# Patient Record
Sex: Male | Born: 1970 | Marital: Single | Smoking: Current some day smoker
Health system: Southern US, Community
[De-identification: ages and names within clinical notes are randomized; demographics above are authoritative.]

## PROBLEM LIST (undated history)

## (undated) HISTORY — PX: EXPLORATORY LAPAROTOMY: SUR591

---

## 2015-09-02 ENCOUNTER — Ambulatory Visit (INDEPENDENT_AMBULATORY_CARE_PROVIDER_SITE_OTHER): Payer: BLUE CROSS/BLUE SHIELD | Admitting: *Deleted

## 2015-09-02 DIAGNOSIS — Z23 Encounter for immunization: Secondary | ICD-10-CM | POA: Diagnosis not present

## 2016-06-08 ENCOUNTER — Other Ambulatory Visit: Payer: Self-pay

## 2016-06-08 LAB — URINALYSIS
Bilirubin, UA: NEGATIVE
Glucose, UA: NEGATIVE
KETONES UA: NEGATIVE
Leukocytes, UA: NEGATIVE
NITRITE UA: NEGATIVE
Protein, UA: NEGATIVE
RBC UA: NEGATIVE
SPEC GRAV UA: 1.025 (ref 1.005–1.030)
UUROB: 0.2 mg/dL (ref 0.2–1.0)
pH, UA: 5.5 (ref 5.0–7.5)

## 2017-03-16 ENCOUNTER — Encounter: Payer: BLUE CROSS/BLUE SHIELD | Admitting: Family Medicine

## 2017-03-17 ENCOUNTER — Encounter: Payer: Self-pay | Admitting: Family Medicine

## 2017-03-21 ENCOUNTER — Ambulatory Visit (INDEPENDENT_AMBULATORY_CARE_PROVIDER_SITE_OTHER): Payer: BLUE CROSS/BLUE SHIELD | Admitting: Family Medicine

## 2017-03-21 ENCOUNTER — Encounter: Payer: Self-pay | Admitting: Family Medicine

## 2017-03-21 VITALS — BP 105/55 | HR 78 | Temp 99.0°F | Ht 71.0 in | Wt 191.0 lb

## 2017-03-21 DIAGNOSIS — Z0000 Encounter for general adult medical examination without abnormal findings: Secondary | ICD-10-CM

## 2017-03-21 NOTE — Progress Notes (Signed)
Subjective:  Patient ID: Vincent Sandoval, male    DOB: 08/31/1971  Age: 46 y.o. MRN: [id number]  CC: New Patient (Initial Visit) (pt here today for Work Physical)   HPI [name] presents for Complete physical examination.   History [name] has no past medical history on file.   He has a past surgical history that includes Exploratory laparotomy (Left).   His family history includes Diabetes in his brother; Hearing loss in his brother; Hypertension in his brother and brother.He reports that he has been smoking Cigarettes.  He has never used smokeless tobacco. He reports that he does not drink alcohol or use drugs.    ROS Review of Systems  Constitutional: Negative for activity change, appetite change, chills, diaphoresis, fatigue, fever and unexpected weight change.  HENT: Negative for congestion, ear pain, hearing loss, postnasal drip, rhinorrhea, sore throat, tinnitus and trouble swallowing.   Eyes: Negative for photophobia, pain, discharge and redness.  Respiratory: Negative for apnea, cough, choking, chest tightness, shortness of breath, wheezing and stridor.   Cardiovascular: Negative for chest pain, palpitations and leg swelling.  Gastrointestinal: Negative for abdominal distention, abdominal pain, blood in stool, constipation, diarrhea, nausea and vomiting.  Endocrine: Negative for cold intolerance, heat intolerance, polydipsia, polyphagia and polyuria.  Genitourinary: Negative for difficulty urinating, dysuria, enuresis, flank pain, frequency, genital sores, hematuria and urgency.  Musculoskeletal: Negative for arthralgias and joint swelling.  Skin: Negative for color change, rash and wound.  Allergic/Immunologic: Negative for immunocompromised state.  Neurological: Negative for dizziness, tremors, seizures, syncope, facial asymmetry, speech difficulty, weakness, light-headedness, numbness and headaches.  Hematological: Does not bruise/bleed easily.  Psychiatric/Behavioral:  Negative for agitation, behavioral problems, confusion, decreased concentration, dysphoric mood, hallucinations, sleep disturbance and suicidal ideas. The patient is not nervous/anxious and is not hyperactive.     Objective:  BP (!) 105/55   Pulse 78   Temp 99 F (37.2 C) (Oral)   Ht 5' 11"  (1.803 m)   Wt 191 lb (86.6 kg)   BMI 26.64 kg/m   BP Readings from Last 3 Encounters:  03/21/17 (!) 105/55    Wt Readings from Last 3 Encounters:  03/21/17 191 lb (86.6 kg)     Physical Exam  Constitutional: He is oriented to person, place, and time. He appears well-developed and well-nourished.  HENT:  Head: Normocephalic and atraumatic.  Mouth/Throat: Oropharynx is clear and moist.  Eyes: EOM are normal. Pupils are equal, round, and reactive to light.  Neck: Normal range of motion. No tracheal deviation present. No thyromegaly present.  Cardiovascular: Normal rate, regular rhythm and normal heart sounds.  Exam reveals no gallop and no friction rub.   No murmur heard. Pulmonary/Chest: Breath sounds normal. He has no wheezes. He has no rales.  Abdominal: Soft. He exhibits no mass. There is no tenderness.  Musculoskeletal: Normal range of motion. He exhibits no edema.  Neurological: He is alert and oriented to person, place, and time.  Skin: Skin is warm and dry.  Psychiatric: He has a normal mood and affect.      Assessment & Plan:   Dj was seen today for new patient (initial visit).  Diagnoses and all orders for this visit:  Well adult exam -     CBC with Differential/Platelet -     CMP14+EGFR -     Lipid panel -     PSA Total (Reflex To Free) -     Urinalysis       Mr. Seda does not currently have medications  on file.   Patient is nonfasting today she will return in the next few days for the blood work mentioned above. Overall a good bill of health and routine follow-up. We did discuss losing weight.  Follow-up: Return in about 1 year (around  03/21/2018).  Claretta Fraise, M.D.

## 2017-05-19 ENCOUNTER — Ambulatory Visit: Payer: BLUE CROSS/BLUE SHIELD | Admitting: Family Medicine

## 2017-06-07 ENCOUNTER — Ambulatory Visit (INDEPENDENT_AMBULATORY_CARE_PROVIDER_SITE_OTHER): Payer: BLUE CROSS/BLUE SHIELD | Admitting: Family Medicine

## 2017-06-07 ENCOUNTER — Other Ambulatory Visit: Payer: Self-pay | Admitting: Family Medicine

## 2017-06-07 ENCOUNTER — Encounter: Payer: Self-pay | Admitting: Family Medicine

## 2017-06-07 ENCOUNTER — Telehealth: Payer: Self-pay | Admitting: Family Medicine

## 2017-06-07 VITALS — BP 125/86 | HR 77 | Temp 97.4°F | Ht 71.0 in | Wt 192.0 lb

## 2017-06-07 DIAGNOSIS — M21611 Bunion of right foot: Secondary | ICD-10-CM

## 2017-06-07 DIAGNOSIS — B351 Tinea unguium: Secondary | ICD-10-CM | POA: Diagnosis not present

## 2017-06-07 DIAGNOSIS — Z302 Encounter for sterilization: Secondary | ICD-10-CM

## 2017-06-07 DIAGNOSIS — Z0000 Encounter for general adult medical examination without abnormal findings: Secondary | ICD-10-CM

## 2017-06-07 MED ORDER — TERBINAFINE HCL 250 MG PO TABS: 250.0000 mg | ORAL_TABLET | Freq: Every day | ORAL | 0 refills | Status: DC

## 2017-06-07 NOTE — Progress Notes (Addendum)
   Subjective:  Patient ID: Vincent Sandoval, male    DOB: 04/27/1971  Age: 46 y.o. MRN: [id number]  CC: Foot Problem (pt here today c/o right foot hurting when he works 3 days in a row wearing his steel toe boots)   HPI Vincent Sandoval presents for several months of right foot pain . "It looks scary" Patient also needs the blood work that he forgot to do after his physical. He came in fasting today to check on his cholesterol and prostate, etc. additionally today he has decided that he wants to have a basilar ectomy and requests referral to urology  [facility] 06/07/2017 03/21/2017  Decreased Interest 0 0  Down, Depressed, Hopeless 0 0  PHQ - 2 Score 0 0    History Vincent Sandoval has no past medical history on file.   He has a past surgical history that includes Exploratory laparotomy (Left).   His family history includes Diabetes in his brother; Hearing loss in his brother; Hypertension in his brother and brother.He reports that he has been smoking Cigarettes.  He has never used smokeless tobacco. He reports that he does not drink alcohol or use drugs.    ROS Review of Systems Negative except as per HPI Objective:  BP 125/86   Pulse 77   Temp (!) 97.4 F (36.3 C) (Oral)   Ht 5\' 11"  (1.803 m)   Wt 192 lb (87.1 kg)   BMI 26.78 kg/m   BP Readings from Last 3 Encounters:  06/07/17 125/86  03/21/17 (!) 105/55    Wt Readings from Last 3 Encounters:  06/07/17 192 lb (87.1 kg)  03/21/17 191 lb (86.6 kg)     Physical Exam  Constitutional: He is oriented to person, place, and time. He appears well-developed and well-nourished. No distress.  HENT:  Head: Normocephalic.  Eyes: Conjunctivae and EOM are normal.  Neck: Normal range of motion.  Musculoskeletal: Normal range of motion.  lg bunion protruding at R 1st MTP   Neurological: He is alert and oriented to person, place, and time.  Skin: Skin is warm and dry. Rash (maceration in web space between toes, discolored white)  noted.  Severe onychomycotic change at right fist & third toenails  Psychiatric: He has a normal mood and affect. His behavior is normal.      Assessment & Plan:   Vincent Sandoval was seen today for foot problem.  Diagnoses and all orders for this visit:  Bunion of great toe of right foot -     Ambulatory referral to Podiatry  Onychomycosis of toenail -     Ambulatory referral to Podiatry  Request for sterilization -     Ambulatory referral to Urology  Other orders -     terbinafine (LAMISIL) 250 MG tablet; Take 1 tablet (250 mg total) by mouth daily.       I am having Vincent Sandoval start on terbinafine.  Allergies as of 06/07/2017   No Known Allergies     Medication List       Accurate as of 06/07/17 12:04 PM. Always use your most recent med list.          terbinafine 250 MG tablet Commonly known as:  LAMISIL Take 1 tablet (250 mg total) by mouth daily.        Follow-up: Return in about 10 months (around 04/07/2018).  Mechele ClaudeWarren Monque Haggar, M.D.

## 2017-06-07 NOTE — Addendum Note (Signed)
Addended by: Prescott GumLAND, Janeane Cozart M on: 06/07/2017 01:50 PM   Modules accepted: Orders

## 2017-06-07 NOTE — Telephone Encounter (Signed)
Please advise 

## 2017-06-07 NOTE — Addendum Note (Signed)
Addended by: Mechele ClaudeSTACKS, Arjay Jaskiewicz on: 06/07/2017 12:04 PM   Modules accepted: Orders

## 2017-06-08 LAB — CMP14+EGFR
ALT: 20 IU/L (ref 0–44)
AST: 20 IU/L (ref 0–40)
Albumin/Globulin Ratio: 1.5 (ref 1.2–2.2)
Albumin: 4.4 g/dL (ref 3.5–5.5)
Alkaline Phosphatase: 64 IU/L (ref 39–117)
BUN/Creatinine Ratio: 12 (ref 9–20)
BUN: 13 mg/dL (ref 6–24)
Bilirubin Total: 0.2 mg/dL (ref 0.0–1.2)
CALCIUM: 9.2 mg/dL (ref 8.7–10.2)
CO2: 15 mmol/L — AB (ref 20–29)
CREATININE: 1.08 mg/dL (ref 0.76–1.27)
Chloride: 107 mmol/L — ABNORMAL HIGH (ref 96–106)
GFR calc Af Amer: 95 mL/min/{1.73_m2} (ref >59)
GFR, EST NON AFRICAN AMERICAN: 82 mL/min/{1.73_m2} (ref >59)
Globulin, Total: 2.9 g/dL (ref 1.5–4.5)
Glucose: 107 mg/dL — ABNORMAL HIGH (ref 65–99)
Potassium: 4.4 mmol/L (ref 3.5–5.2)
Sodium: 140 mmol/L (ref 134–144)
Total Protein: 7.3 g/dL (ref 6.0–8.5)

## 2017-06-08 LAB — CBC WITH DIFFERENTIAL/PLATELET
Basophils Absolute: 0 10*3/uL (ref 0.0–0.2)
Basos: 0 %
EOS (ABSOLUTE): 0.2 10*3/uL (ref 0.0–0.4)
EOS: 2 %
HEMATOCRIT: 43 % (ref 37.5–51.0)
Hemoglobin: 14.9 g/dL (ref 13.0–17.7)
IMMATURE GRANS (ABS): 0 10*3/uL (ref 0.0–0.1)
IMMATURE GRANULOCYTES: 0 %
Lymphocytes Absolute: 3.4 10*3/uL — ABNORMAL HIGH (ref 0.7–3.1)
Lymphs: 37 %
MCH: 30.7 pg (ref 26.6–33.0)
MCHC: 34.7 g/dL (ref 31.5–35.7)
MCV: 89 fL (ref 79–97)
MONOS ABS: 0.9 10*3/uL (ref 0.1–0.9)
Monocytes: 9 %
NEUTROS PCT: 52 %
Neutrophils Absolute: 4.7 10*3/uL (ref 1.4–7.0)
PLATELETS: 261 10*3/uL (ref 150–379)
RBC: 4.85 x10E6/uL (ref 4.14–5.80)
RDW: 13.7 % (ref 12.3–15.4)
WBC: 9.2 10*3/uL (ref 3.4–10.8)

## 2017-06-08 LAB — LIPID PANEL
CHOL/HDL RATIO: 4.1 ratio (ref 0.0–5.0)
Cholesterol, Total: 209 mg/dL — ABNORMAL HIGH (ref 100–199)
HDL: 51 mg/dL (ref >39)
LDL CALC: 130 mg/dL — AB (ref 0–99)
TRIGLYCERIDES: 142 mg/dL (ref 0–149)
VLDL CHOLESTEROL CAL: 28 mg/dL (ref 5–40)

## 2017-06-08 LAB — PSA TOTAL (REFLEX TO FREE): Prostate Specific Ag, Serum: 0.5 ng/mL (ref 0.0–4.0)

## 2017-06-21 NOTE — Telephone Encounter (Signed)
Please address

## 2017-06-21 NOTE — Telephone Encounter (Signed)
Please refer as requested 

## 2017-06-22 ENCOUNTER — Other Ambulatory Visit: Payer: Self-pay

## 2017-06-22 DIAGNOSIS — Z3009 Encounter for other general counseling and advice on contraception: Secondary | ICD-10-CM

## 2017-06-22 NOTE — Telephone Encounter (Signed)
[facility]

## 2017-09-05 ENCOUNTER — Ambulatory Visit: Payer: Self-pay | Admitting: Urology

## 2018-03-23 ENCOUNTER — Ambulatory Visit: Payer: BLUE CROSS/BLUE SHIELD | Admitting: Family Medicine

## 2018-03-26 ENCOUNTER — Encounter: Payer: Self-pay | Admitting: Family Medicine

## 2018-04-17 ENCOUNTER — Ambulatory Visit (INDEPENDENT_AMBULATORY_CARE_PROVIDER_SITE_OTHER): Payer: BLUE CROSS/BLUE SHIELD | Admitting: Family Medicine

## 2018-04-17 ENCOUNTER — Encounter: Payer: Self-pay | Admitting: Family Medicine

## 2018-04-17 VITALS — BP 103/52 | HR 68 | Temp 97.8°F | Ht 71.0 in | Wt 201.1 lb

## 2018-04-17 DIAGNOSIS — K625 Hemorrhage of anus and rectum: Secondary | ICD-10-CM

## 2018-04-17 DIAGNOSIS — K641 Second degree hemorrhoids: Secondary | ICD-10-CM

## 2018-04-17 DIAGNOSIS — R5383 Other fatigue: Secondary | ICD-10-CM

## 2018-04-17 DIAGNOSIS — Z0000 Encounter for general adult medical examination without abnormal findings: Secondary | ICD-10-CM

## 2018-04-17 DIAGNOSIS — M79672 Pain in left foot: Secondary | ICD-10-CM

## 2018-04-17 LAB — URINALYSIS
Bilirubin, UA: NEGATIVE
Glucose, UA: NEGATIVE
Ketones, UA: NEGATIVE
LEUKOCYTES UA: NEGATIVE
NITRITE UA: NEGATIVE
PH UA: 5.5 (ref 5.0–7.5)
RBC UA: NEGATIVE
Specific Gravity, UA: 1.02 (ref 1.005–1.030)
Urobilinogen, Ur: 0.2 mg/dL (ref 0.2–1.0)

## 2018-04-17 MED ORDER — PSYLLIUM 58.6 % PO POWD: ORAL | 12 refills | Status: DC

## 2018-04-17 NOTE — Progress Notes (Addendum)
Subjective:  Patient ID: Vincent Sandoval, male    DOB: 1971/03/11  Age: 47 y.o. MRN: [id number]  CC: No chief complaint on file.   HPI Vincent Sandoval presents for Annual physical exam  Patient reports that he has been on some weight to loss of energy over this last year.Marland Kitchen  He just does not feel like exercising he also admits that he is not eating right.  He eats a lot of fried food and some sweets as well.  His main concern today however is that he has noted some blood in his stool.  This is happens on a few occasions.  The most recent was a few weeks ago.  It is not associated with any pain or diarrhea or constipation.  He has had no nausea or vomiting.  There has been no melena.  No epigastric pain or heartburn.  Depression screen [facility] 2/9 04/17/2018 06/07/2017 03/21/2017  Decreased Interest 0 0 0  Down, Depressed, Hopeless 0 0 0  PHQ - 2 Score 0 0 0    History Vincent Sandoval has no past medical history on file.   He has a past surgical history that includes Exploratory laparotomy (Left).   His family history includes Diabetes in his brother; Hearing loss in his brother; Hypertension in his brother and brother.He reports that he has been smoking cigarettes.  He has never used smokeless tobacco. He reports that he does not drink alcohol or use drugs.    ROS Review of Systems  Constitutional: Negative.   HENT: Negative.   Eyes: Negative for visual disturbance.  Respiratory: Negative for cough and shortness of breath.   Cardiovascular: Negative for chest pain and leg swelling.  Gastrointestinal: Positive for blood in stool. Negative for abdominal distention, abdominal pain, diarrhea, nausea, rectal pain and vomiting.  Genitourinary: Negative for difficulty urinating.  Musculoskeletal: Negative for arthralgias and myalgias.  Skin: Negative for rash.  Neurological: Negative for headaches.  Psychiatric/Behavioral: Negative for sleep disturbance.    Objective:  BP (!) 103/52   Pulse 68   Temp  97.8 F (36.6 C) (Oral)   Ht 5' 11"  (1.803 m)   Wt 201 lb 2 oz (91.2 kg)   BMI 28.05 kg/m   BP Readings from Last 3 Encounters:  04/17/18 (!) 103/52  06/07/17 125/86  03/21/17 (!) 105/55    Wt Readings from Last 3 Encounters:  04/17/18 201 lb 2 oz (91.2 kg)  06/07/17 192 lb (87.1 kg)  03/21/17 191 lb (86.6 kg)     Physical Exam  Constitutional: He is oriented to person, place, and time. No distress.  HENT:  Head: Normocephalic and atraumatic.  Right Ear: External ear normal.  Left Ear: External ear normal.  Nose: Nose normal.  Mouth/Throat: Oropharynx is clear and moist.  Eyes: Pupils are equal, round, and reactive to light. Conjunctivae and EOM are normal. No scleral icterus.  Neck: Normal range of motion. Neck supple. No JVD present. No tracheal deviation present. No thyromegaly present.  Cardiovascular: Normal rate, regular rhythm and normal heart sounds. Exam reveals no gallop and no friction rub.  No murmur heard. Pulmonary/Chest: Effort normal and breath sounds normal. No respiratory distress. He has no wheezes. He has no rales. He exhibits no tenderness.  Abdominal: Soft. Bowel sounds are normal. He exhibits no distension and no mass. There is no tenderness. There is no rebound, no guarding and no tenderness at McBurney's point. No hernia. Hernia confirmed negative in the right inguinal area and confirmed negative in the  left inguinal area.  Genitourinary: Prostate normal and testes normal. Rectal exam shows external hemorrhoid and internal hemorrhoid. Prostate is not enlarged. Right testis shows no mass. Left testis shows no mass. Uncircumcised.  Musculoskeletal: Normal range of motion. He exhibits no edema or tenderness.  Lymphadenopathy:    He has no cervical adenopathy. No inguinal adenopathy noted on the right or left side.  Neurological: He is alert and oriented to person, place, and time. He displays normal reflexes. No cranial nerve deficit. He exhibits normal  muscle tone. Coordination normal.  Skin: Skin is warm and dry. No rash noted. He is not diaphoretic. No erythema.  Psychiatric: Judgment normal.  Vitals reviewed.     Assessment & Plan:   Diagnoses and all orders for this visit:  Well adult exam -     CBC with Differential/Platelet -     CMP14+EGFR -     PSA Total (Reflex To Free) -     Lipid panel -     Urinalysis -     VITAMIN D 25 Hydroxy (Vit-D Deficiency, Fractures)  Left foot pain -     Ambulatory referral to Podiatry  Other fatigue -     TSH  Rectal bleeding -     Fecal occult blood, imunochemical  Grade II hemorrhoids -     Fecal occult blood, imunochemical  Other orders -     psyllium (METAMUCIL SMOOTH TEXTURE) 58.6 % powder; 1 tbsp daily       I have discontinued Vincent Sandoval's terbinafine. I am also having him start on psyllium.  Allergies as of 04/17/2018   No Known Allergies     Medication List        Accurate as of 04/17/18  2:21 PM. Always use your most recent med list.          psyllium 58.6 % powder Commonly known as:  METAMUCIL SMOOTH TEXTURE 1 tbsp daily        Follow-up: Return in about 1 year (around 04/18/2019).  Claretta Fraise, M.D.

## 2018-04-17 NOTE — Patient Instructions (Signed)
Metamucil one tablespoon 1-2 times daily (otc)    Heart-Healthy Eating Plan Heart-healthy meal planning includes:  Limiting unhealthy fats.  Increasing healthy fats.  Making other small dietary changes.  You may need to talk with your doctor or a diet specialist (dietitian) to create an eating plan that is right for you. What types of fat should I choose?  Choose healthy fats. These include olive oil and canola oil, flaxseeds, walnuts, almonds, and seeds.  Eat more omega-3 fats. These include salmon, mackerel, sardines, tuna, flaxseed oil, and ground flaxseeds. Try to eat fish at least twice each week.  Limit saturated fats. ? Saturated fats are often found in animal products, such as meats, butter, and cream. ? Plant sources of saturated fats include palm oil, palm kernel oil, and coconut oil.  Avoid foods with partially hydrogenated oils in them. These include stick margarine, some tub margarines, cookies, crackers, and other baked goods. These contain trans fats. What general guidelines do I need to follow?  Check food labels carefully. Identify foods with trans fats or high amounts of saturated fat.  Fill one half of your plate with vegetables and green salads. Eat 4-5 servings of vegetables per day. A serving of vegetables is: ? 1 cup of raw leafy vegetables. ?  cup of raw or cooked cut-up vegetables. ?  cup of vegetable juice.  Fill one fourth of your plate with whole grains. Look for the word "whole" as the first word in the ingredient list.  Fill one fourth of your plate with lean protein foods.  Eat 4-5 servings of fruit per day. A serving of fruit is: ? One medium whole fruit. ?  cup of dried fruit. ?  cup of fresh, frozen, or canned fruit. ?  cup of 100% fruit juice.  Eat more foods that contain soluble fiber. These include apples, broccoli, carrots, beans, peas, and barley. Try to get 20-30 g of fiber per day.  Eat more home-cooked food. Eat  less restaurant, buffet, and fast food.  Limit or avoid alcohol.  Limit foods high in starch and sugar.  Avoid fried foods.  Avoid frying your food. Try baking, boiling, grilling, or broiling it instead. You can also reduce fat by: ? Removing the skin from poultry. ? Removing all visible fats from meats. ? Skimming the fat off of stews, soups, and gravies before serving them. ? Steaming vegetables in water or broth.  Lose weight if you are overweight.  Eat 4-5 servings of nuts, legumes, and seeds per week: ? One serving of dried beans or legumes equals  cup after being cooked. ? One serving of nuts equals 1 ounces. ? One serving of seeds equals  ounce or one tablespoon.  You may need to keep track of how much salt or sodium you eat. This is especially true if you have high blood pressure. Talk with your doctor or dietitian to get more information. What foods can I eat? Grains Breads, including [place], white, pita, wheat, raisin, rye, oatmeal, and [place]. Tortillas that are neither fried nor made with lard or trans fat. Low-fat rolls, including hotdog and hamburger buns and English muffins. Biscuits. Muffins. Waffles. Pancakes. Light popcorn. Whole-grain cereals. Flatbread. Melba toast. Pretzels. Breadsticks. Rusks. Low-fat snacks. Low-fat crackers, including oyster, saltine, matzo, graham, animal, and rye. Rice and pasta, including brown rice and pastas that are made with whole wheat. Vegetables All vegetables. Fruits All fruits, but limit coconut. Meats and Other Protein Sources Lean, well-trimmed  beef, veal, pork, and lamb. Chicken and [place] without skin. All fish and shellfish. Wild duck, rabbit, pheasant, and venison. Egg whites or low-cholesterol egg substitutes. Dried beans, peas, lentils, and tofu. Seeds and most nuts. Dairy Low-fat or nonfat cheeses, including ricotta, string, and mozzarella. Skim or 1% milk that is liquid, powdered, or evaporated. Buttermilk that is made  with low-fat milk. Nonfat or low-fat yogurt. Beverages Mineral water. Diet carbonated beverages. Sweets and Desserts Sherbets and fruit ices. Honey, jam, marmalade, jelly, and syrups. Meringues and gelatins. Pure sugar candy, such as hard candy, jelly beans, gumdrops, mints, marshmallows, and small amounts of dark chocolate. [employer]. Eat all sweets and desserts in moderation. Fats and Oils Nonhydrogenated (trans-free) margarines. Vegetable oils, including soybean, sesame, sunflower, olive, peanut, safflower, corn, canola, and cottonseed. Salad dressings or mayonnaise made with a vegetable oil. Limit added fats and oils that you use for cooking, baking, salads, and as spreads. Other Cocoa powder. Coffee and tea. All seasonings and condiments. The items listed above may not be a complete list of recommended foods or beverages. Contact your dietitian for more options. What foods are not recommended? Grains Breads that are made with saturated or trans fats, oils, or whole milk. Croissants. Butter rolls. Cheese breads. Sweet rolls. Donuts. Buttered popcorn. Chow mein noodles. High-fat crackers, such as cheese or butter crackers. Meats and Other Protein Sources Fatty meats, such as hotdogs, short ribs, sausage, spareribs, bacon, rib eye roast or steak, and mutton. High-fat deli meats, such as salami and bologna. Caviar. Domestic duck and goose. Organ meats, such as kidney, liver, sweetbreads, and heart. Dairy Cream, sour cream, cream cheese, and creamed cottage cheese. Whole-milk cheeses, including blue (bleu), [address], [place], [place], [address], [place], [address], cheddar, [place], and [place]. Whole or 2% milk that is liquid, evaporated, or condensed. Whole buttermilk. Cream sauce or high-fat cheese sauce. Yogurt that is made from whole milk. Beverages Regular sodas and juice drinks with added sugar. Sweets and Desserts Frosting. Pudding. Cookies. Cakes other than angel food cake. Candy  that has milk chocolate or white chocolate, hydrogenated fat, butter, coconut, or unknown ingredients. Buttered syrups. Full-fat ice cream or ice cream drinks. Fats and Oils Gravy that has suet, meat fat, or shortening. Cocoa butter, hydrogenated oils, palm oil, coconut oil, palm kernel oil. These can often be found in baked products, candy, fried foods, nondairy creamers, and whipped toppings. Solid fats and shortenings, including bacon fat, salt pork, lard, and butter. Nondairy cream substitutes, such as coffee creamers and sour cream substitutes. Salad dressings that are made of unknown oils, cheese, or sour cream. The items listed above may not be a complete list of foods and beverages to avoid. Contact your dietitian for more information. This information is not intended to replace advice given to you by your health care provider. Make sure you discuss any questions you have with your health care provider. Document Released: 05/08/2012 Document Revised: 04/14/2016 Document Reviewed: 05/01/2014 Elsevier Interactive Patient Education  [employer].

## 2018-04-18 LAB — CBC WITH DIFFERENTIAL/PLATELET
BASOS: 1 %
Basophils Absolute: 0.1 10*3/uL (ref 0.0–0.2)
EOS (ABSOLUTE): 0.2 10*3/uL (ref 0.0–0.4)
EOS: 2 %
HEMATOCRIT: 42.5 % (ref 37.5–51.0)
HEMOGLOBIN: 14.8 g/dL (ref 13.0–17.7)
IMMATURE GRANS (ABS): 0 10*3/uL (ref 0.0–0.1)
IMMATURE GRANULOCYTES: 1 %
LYMPHS: 41 %
Lymphocytes Absolute: 2.7 10*3/uL (ref 0.7–3.1)
MCH: 30.3 pg (ref 26.6–33.0)
MCHC: 34.8 g/dL (ref 31.5–35.7)
MCV: 87 fL (ref 79–97)
MONOS ABS: 0.6 10*3/uL (ref 0.1–0.9)
Monocytes: 10 %
Neutrophils Absolute: 3.1 10*3/uL (ref 1.4–7.0)
Neutrophils: 45 %
Platelets: 232 10*3/uL (ref 150–450)
RBC: 4.89 x10E6/uL (ref 4.14–5.80)
RDW: 14.2 % (ref 12.3–15.4)
WBC: 6.6 10*3/uL (ref 3.4–10.8)

## 2018-04-18 LAB — LIPID PANEL
CHOL/HDL RATIO: 5.2 ratio — AB (ref 0.0–5.0)
Cholesterol, Total: 197 mg/dL (ref 100–199)
HDL: 38 mg/dL — ABNORMAL LOW (ref >39)
LDL CALC: 122 mg/dL — AB (ref 0–99)
TRIGLYCERIDES: 185 mg/dL — AB (ref 0–149)
VLDL Cholesterol Cal: 37 mg/dL (ref 5–40)

## 2018-04-18 LAB — CMP14+EGFR
ALBUMIN: 4.4 g/dL (ref 3.5–5.5)
ALT: 25 IU/L (ref 0–44)
AST: 19 IU/L (ref 0–40)
Albumin/Globulin Ratio: 1.5 (ref 1.2–2.2)
Alkaline Phosphatase: 66 IU/L (ref 39–117)
BUN / CREAT RATIO: 17 (ref 9–20)
BUN: 19 mg/dL (ref 6–24)
Bilirubin Total: 0.3 mg/dL (ref 0.0–1.2)
CALCIUM: 8.7 mg/dL (ref 8.7–10.2)
CO2: 19 mmol/L — AB (ref 20–29)
CREATININE: 1.15 mg/dL (ref 0.76–1.27)
Chloride: 105 mmol/L (ref 96–106)
GFR calc Af Amer: 87 mL/min/{1.73_m2} (ref >59)
GFR, EST NON AFRICAN AMERICAN: 75 mL/min/{1.73_m2} (ref >59)
Globulin, Total: 2.9 g/dL (ref 1.5–4.5)
Glucose: 103 mg/dL — ABNORMAL HIGH (ref 65–99)
Potassium: 4.2 mmol/L (ref 3.5–5.2)
Sodium: 138 mmol/L (ref 134–144)
TOTAL PROTEIN: 7.3 g/dL (ref 6.0–8.5)

## 2018-04-18 LAB — VITAMIN D 25 HYDROXY (VIT D DEFICIENCY, FRACTURES): VIT D 25 HYDROXY: 7.9 ng/mL — AB (ref 30.0–100.0)

## 2018-04-18 LAB — TSH: TSH: 0.805 u[IU]/mL (ref 0.450–4.500)

## 2018-04-18 LAB — PSA TOTAL (REFLEX TO FREE): Prostate Specific Ag, Serum: 0.5 ng/mL (ref 0.0–4.0)

## 2018-04-23 ENCOUNTER — Encounter: Payer: Self-pay | Admitting: *Deleted

## 2019-04-12 ENCOUNTER — Telehealth: Payer: Self-pay | Admitting: Family Medicine

## 2019-04-12 ENCOUNTER — Encounter (INDEPENDENT_AMBULATORY_CARE_PROVIDER_SITE_OTHER): Payer: Self-pay

## 2019-04-19 ENCOUNTER — Encounter: Payer: BLUE CROSS/BLUE SHIELD | Admitting: Family Medicine

## 2019-09-25 ENCOUNTER — Other Ambulatory Visit: Payer: Self-pay

## 2019-09-26 ENCOUNTER — Ambulatory Visit (INDEPENDENT_AMBULATORY_CARE_PROVIDER_SITE_OTHER): Payer: Managed Care, Other (non HMO) | Admitting: Family Medicine

## 2019-09-26 ENCOUNTER — Encounter: Payer: Self-pay | Admitting: Family Medicine

## 2019-09-26 VITALS — BP 108/74 | HR 93 | Temp 98.9°F | Ht 71.0 in | Wt 191.6 lb

## 2019-09-26 DIAGNOSIS — Z0000 Encounter for general adult medical examination without abnormal findings: Secondary | ICD-10-CM | POA: Diagnosis not present

## 2019-09-26 NOTE — Progress Notes (Signed)
Subjective:  Patient ID: Vincent Sandoval, male    DOB: January 13, 1971  Age: 48 y.o. MRN: [id number]  CC: Annual Exam   HPI Vincent Sandoval presents for CPE  Depression screen [facility] 2/9 09/26/2019 04/17/2018 06/07/2017  Decreased Interest 0 0 0  Down, Depressed, Hopeless 0 0 0  PHQ - 2 Score 0 0 0    History Vincent Sandoval has no past medical history on file.   He has a past surgical history that includes Exploratory laparotomy (Left).   His family history includes Diabetes in his brother; Hearing loss in his brother; Hypertension in his brother and brother.He reports that he has been smoking cigarettes. He has never used smokeless tobacco. He reports that he does not drink alcohol or use drugs.    ROS Review of Systems  Constitutional: Negative for activity change, fatigue and unexpected weight change.  HENT: Negative for congestion, ear pain, hearing loss, postnasal drip and trouble swallowing.   Eyes: Negative for pain and visual disturbance.  Respiratory: Negative for cough, chest tightness and shortness of breath.   Cardiovascular: Negative for chest pain, palpitations and leg swelling.  Gastrointestinal: Negative for abdominal distention, abdominal pain, blood in stool, constipation, diarrhea, nausea and vomiting.  Endocrine: Negative for cold intolerance, heat intolerance and polydipsia.  Genitourinary: Negative for difficulty urinating, dysuria, flank pain, frequency and urgency.  Musculoskeletal: Negative for arthralgias and joint swelling.  Skin: Negative for color change, rash and wound.  Neurological: Negative for dizziness, syncope, speech difficulty, weakness, light-headedness, numbness and headaches.  Hematological: Does not bruise/bleed easily.  Psychiatric/Behavioral: Negative for confusion, decreased concentration, dysphoric mood and sleep disturbance. The patient is not nervous/anxious.     Objective:  BP 108/74   Pulse 93   Temp 98.9 F (37.2 C) (Temporal)   Ht 5\' 11"   (1.803 m)   Wt 191 lb 9.6 oz (86.9 kg)   SpO2 95%   BMI 26.72 kg/m   BP Readings from Last 3 Encounters:  09/26/19 108/74  04/17/18 (!) 103/52  06/07/17 125/86    Wt Readings from Last 3 Encounters:  09/26/19 191 lb 9.6 oz (86.9 kg)  04/17/18 201 lb 2 oz (91.2 kg)  06/07/17 192 lb (87.1 kg)     Physical Exam Constitutional:      Appearance: He is well-developed.  HENT:     Head: Normocephalic and atraumatic.  Eyes:     Pupils: Pupils are equal, round, and reactive to light.  Neck:     Musculoskeletal: Normal range of motion.     Thyroid: No thyromegaly.     Trachea: No tracheal deviation.  Cardiovascular:     Rate and Rhythm: Normal rate and regular rhythm.     Heart sounds: Normal heart sounds. No murmur. No friction rub. No gallop.   Pulmonary:     Breath sounds: Normal breath sounds. No wheezing or rales.  Abdominal:     General: Bowel sounds are normal. There is no distension.     Palpations: Abdomen is soft. There is no mass.     Tenderness: There is no abdominal tenderness.     Hernia: There is no hernia in the left inguinal area.  Genitourinary:    Penis: Normal.      Scrotum/Testes: Normal.  Musculoskeletal: Normal range of motion.  Lymphadenopathy:     Cervical: No cervical adenopathy.  Skin:    General: Skin is warm and dry.  Neurological:     Mental Status: He is alert and oriented to  person, place, and time.       Assessment & Plan:   Vincent Sandoval was seen today for annual exam.  Diagnoses and all orders for this visit:  Well adult exam -     CBC -     CMP -     Lipid -     Urinalysis- dip only -     HIV antibody       I am having Vincent Sandoval maintain his psyllium.  Allergies as of 09/26/2019   No Known Allergies     Medication List       Accurate as of September 26, 2019 11:59 PM. If you have any questions, ask your nurse or doctor.        psyllium 58.6 % powder Commonly known as: Metamucil Smooth Texture 1 tbsp daily         Follow-up: Return in about 1 year (around 09/25/2020).  Claretta Fraise, M.D.

## 2019-09-27 LAB — CMP14+EGFR
ALT: 24 IU/L (ref 0–44)
AST: 21 IU/L (ref 0–40)
Albumin/Globulin Ratio: 1.7 (ref 1.2–2.2)
Albumin: 4.5 g/dL (ref 4.0–5.0)
Alkaline Phosphatase: 75 IU/L (ref 39–117)
BUN/Creatinine Ratio: 12 (ref 9–20)
BUN: 14 mg/dL (ref 6–24)
Bilirubin Total: 0.2 mg/dL (ref 0.0–1.2)
CO2: 22 mmol/L (ref 20–29)
Calcium: 9.2 mg/dL (ref 8.7–10.2)
Chloride: 104 mmol/L (ref 96–106)
Creatinine, Ser: 1.21 mg/dL (ref 0.76–1.27)
GFR calc Af Amer: 81 mL/min/{1.73_m2} (ref >59)
GFR calc non Af Amer: 70 mL/min/{1.73_m2} (ref >59)
Globulin, Total: 2.6 g/dL (ref 1.5–4.5)
Glucose: 92 mg/dL (ref 65–99)
Potassium: 4.3 mmol/L (ref 3.5–5.2)
Sodium: 140 mmol/L (ref 134–144)
Total Protein: 7.1 g/dL (ref 6.0–8.5)

## 2019-09-27 LAB — CBC WITH DIFFERENTIAL/PLATELET
Basophils Absolute: 0.1 10*3/uL (ref 0.0–0.2)
Basos: 1 %
EOS (ABSOLUTE): 0.2 10*3/uL (ref 0.0–0.4)
Eos: 2 %
Hematocrit: 44.8 % (ref 37.5–51.0)
Hemoglobin: 15 g/dL (ref 13.0–17.7)
Immature Grans (Abs): 0 10*3/uL (ref 0.0–0.1)
Immature Granulocytes: 0 %
Lymphocytes Absolute: 3.5 10*3/uL — ABNORMAL HIGH (ref 0.7–3.1)
Lymphs: 37 %
MCH: 30.2 pg (ref 26.6–33.0)
MCHC: 33.5 g/dL (ref 31.5–35.7)
MCV: 90 fL (ref 79–97)
Monocytes Absolute: 0.8 10*3/uL (ref 0.1–0.9)
Monocytes: 9 %
Neutrophils Absolute: 5 10*3/uL (ref 1.4–7.0)
Neutrophils: 51 %
Platelets: 250 10*3/uL (ref 150–450)
RBC: 4.96 x10E6/uL (ref 4.14–5.80)
RDW: 12.7 % (ref 11.6–15.4)
WBC: 9.7 10*3/uL (ref 3.4–10.8)

## 2019-09-27 LAB — LIPID PANEL
Chol/HDL Ratio: 3.8 ratio (ref 0.0–5.0)
Cholesterol, Total: 184 mg/dL (ref 100–199)
HDL: 49 mg/dL (ref >39)
LDL Chol Calc (NIH): 76 mg/dL (ref 0–99)
Triglycerides: 371 mg/dL — ABNORMAL HIGH (ref 0–149)
VLDL Cholesterol Cal: 59 mg/dL — ABNORMAL HIGH (ref 5–40)

## 2019-09-27 LAB — HIV ANTIBODY (ROUTINE TESTING W REFLEX): HIV Screen 4th Generation wRfx: NONREACTIVE

## 2019-09-28 NOTE — Progress Notes (Signed)
Hello Vincent Sandoval,  Your lab result is normal and/or stable.Some minor variations that are not significant are commonly marked abnormal, but do not represent any medical problem for you.  Best regards, Claretta Fraise, M.D.

## 2019-09-29 ENCOUNTER — Encounter: Payer: Self-pay | Admitting: Family Medicine

## 2019-11-19 ENCOUNTER — Telehealth: Payer: Self-pay | Admitting: Family Medicine

## 2019-11-19 NOTE — Telephone Encounter (Signed)
Pt called and appt made for 1/4

## 2019-11-25 ENCOUNTER — Ambulatory Visit (INDEPENDENT_AMBULATORY_CARE_PROVIDER_SITE_OTHER): Payer: 59

## 2019-11-25 ENCOUNTER — Encounter: Payer: Self-pay | Admitting: Family Medicine

## 2019-11-25 ENCOUNTER — Ambulatory Visit (INDEPENDENT_AMBULATORY_CARE_PROVIDER_SITE_OTHER): Payer: 59 | Admitting: Family Medicine

## 2019-11-25 ENCOUNTER — Other Ambulatory Visit: Payer: Self-pay

## 2019-11-25 ENCOUNTER — Ambulatory Visit: Payer: Managed Care, Other (non HMO) | Admitting: Family Medicine

## 2019-11-25 VITALS — BP 100/58 | HR 71 | Temp 97.8°F | Resp 20 | Ht 71.0 in | Wt 197.0 lb

## 2019-11-25 DIAGNOSIS — M7741 Metatarsalgia, right foot: Secondary | ICD-10-CM | POA: Diagnosis not present

## 2019-11-25 DIAGNOSIS — M79671 Pain in right foot: Secondary | ICD-10-CM

## 2019-11-25 MED ORDER — NAPROXEN 500 MG PO TABS: 500.0000 mg | ORAL_TABLET | Freq: Two times a day (BID) | ORAL | 1 refills | Status: DC
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
## 2019-11-25 NOTE — Patient Instructions (Signed)

## 2019-12-02 ENCOUNTER — Other Ambulatory Visit: Payer: Self-pay | Admitting: *Deleted

## 2019-12-02 ENCOUNTER — Telehealth: Payer: Self-pay | Admitting: Family Medicine

## 2019-12-02 DIAGNOSIS — M79671 Pain in right foot: Secondary | ICD-10-CM

## 2019-12-02 DIAGNOSIS — M7741 Metatarsalgia, right foot: Secondary | ICD-10-CM

## 2019-12-02 NOTE — Telephone Encounter (Signed)
Can place referral to ortho, does he have a preference?

## 2019-12-02 NOTE — Telephone Encounter (Signed)
Patient states the naproxen (NAPROSYN) 500 MG tablet does not help his pain at all. He stated Rakes told him to call back if it didn't and she would Rx something else or put in referral. Please advise.

## 2019-12-02 NOTE — Telephone Encounter (Signed)
Referral placed. Patient aware.  

## 2019-12-06 ENCOUNTER — Other Ambulatory Visit: Payer: Self-pay

## 2019-12-09 ENCOUNTER — Encounter: Payer: Self-pay | Admitting: Family Medicine

## 2019-12-09 ENCOUNTER — Ambulatory Visit: Payer: 59 | Admitting: Family Medicine

## 2019-12-10 ENCOUNTER — Encounter: Payer: Self-pay | Admitting: Family Medicine

## 2019-12-10 ENCOUNTER — Ambulatory Visit (INDEPENDENT_AMBULATORY_CARE_PROVIDER_SITE_OTHER): Payer: 59 | Admitting: Family Medicine

## 2019-12-10 VITALS — BP 112/72 | HR 72 | Temp 98.2°F | Wt 199.2 lb

## 2019-12-10 DIAGNOSIS — M7741 Metatarsalgia, right foot: Secondary | ICD-10-CM | POA: Diagnosis not present

## 2019-12-10 MED ORDER — DICLOFENAC SODIUM 75 MG PO TBEC: 75.0000 mg | DELAYED_RELEASE_TABLET | Freq: Two times a day (BID) | ORAL | 2 refills | Status: DC

## 2019-12-10 MED ORDER — PREDNISONE 10 MG PO TABS: ORAL_TABLET | ORAL | 0 refills | Status: DC

## 2019-12-10 NOTE — Progress Notes (Signed)
Chief Complaint  Patient presents with  . Foot Pain    right foot pain, seen week ago still hurting, had surgery on it a couple yrs ago    HPI  Patient presents today for continued pain in the right foot.  He did not get relief from the naproxen.  He does continue to work daily.  The naproxen also upset his stomach.  The patient is concerned now that he cannot continue to work with the pain at current level.  Pain has been ongoing for several weeks.  Increasing over time.  He has a history of bunionectomy 2 years ago the pain is in a similar area.  He was seen recently and had an x-ray.  This showed the bunionectomy hardware and arthritis of the first and second MTP.  PMH: Smoking status noted ROS: Per HPI  Objective: BP 112/72   Pulse 72   Temp 98.2 F (36.8 C) (Temporal)   Wt 199 lb 3.2 oz (90.4 kg)   SpO2 97%   BMI 27.78 kg/m  Gen: NAD, alert, cooperative with exam HEENT: NCAT, EOMI, PERRL Ext: No edema, warm Neuro: Alert and oriented, No gross deficits  Assessment and plan:  1. Metatarsalgia of right foot     Meds ordered this encounter  Medications  . predniSONE (DELTASONE) 10 MG tablet    Sig: Take 5 daily for 2 days followed by 4,3,2 and 1 for 2 days each.    Dispense:  30 tablet    Refill:  0  . diclofenac (VOLTAREN) 75 MG EC tablet    Sig: Take 1 tablet (75 mg total) by mouth 2 (two) times daily. For muscle and  Joint pain    Dispense:  60 tablet    Refill:  2   The dorsal surface of the right foot at the base of the second toe in the area between the first and second MTP was prepped with Betadine.  The area was palpated to find the joint interspace and reveal the point of maximal tenderness.  Using ethyl chloride for topical anesthesia, the area of the first MTP was infiltrated with 0.25 mL Celestone and 0.25 mL Marcaine. Use the prednisone Dosepak as directed.  Do not start the diclofenac until finished with the prednisone.  We discussed tendency for nausea and  sleep interruption from prednisone as well as GI distress from using diclofenac.  Patient was instructed to spread the prednisone out through the day.  He should take both the prednisone and diclofenac with food.   Follow up as needed.  Mechele Claude, MD

## 2019-12-10 NOTE — Progress Notes (Unsigned)
No chief complaint on file.   HPI  Patient presents today for ***  PMH: Smoking status noted ROS: Per HPI  Objective: There were no vitals taken for this visit. Gen: NAD, alert, cooperative with exam HEENT: NCAT, EOMI, PERRL CV: RRR, good S1/S2, no murmur Resp: CTABL, no wheezes, non-labored Abd: SNTND, BS present, no guarding or organomegaly Ext: No edema, warm Neuro: Alert and oriented, No gross deficits  Assessment and plan:  No diagnosis found.  No orders of the defined types were placed in this encounter.   No orders of the defined types were placed in this encounter.   Follow up as needed.  Mechele Claude, MD

## 2020-02-10 IMAGING — DX DG FOOT COMPLETE 3+V*R*
3 series · 4 of 4 positions shown · non-contrast
Comparison: None.

CLINICAL DATA: Pain

EXAM:
RIGHT FOOT COMPLETE - 3+ VIEW

[foot ap]
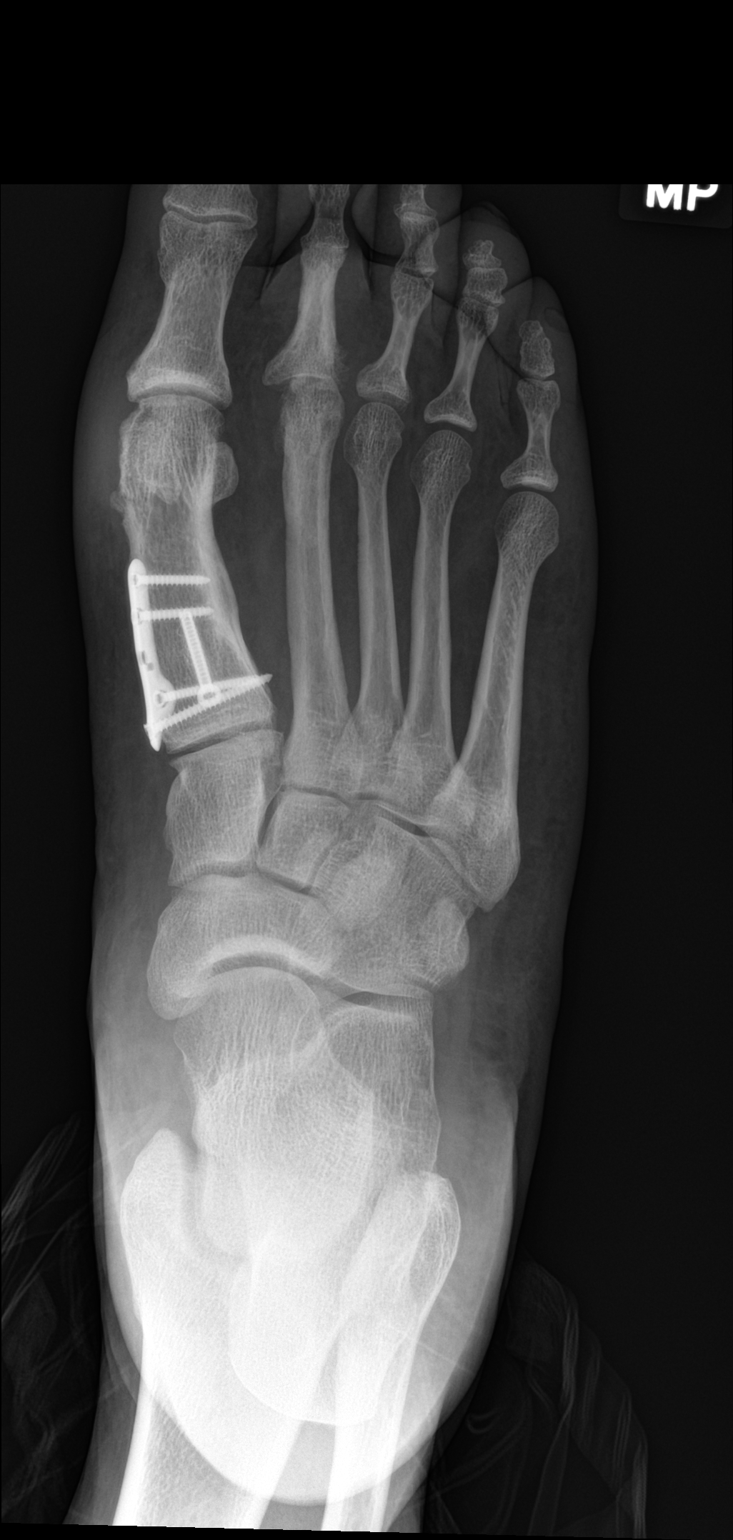

[Series 2: foot obl · 0.14mm/px · 2 of 2 slices shown]
[im 1/2]
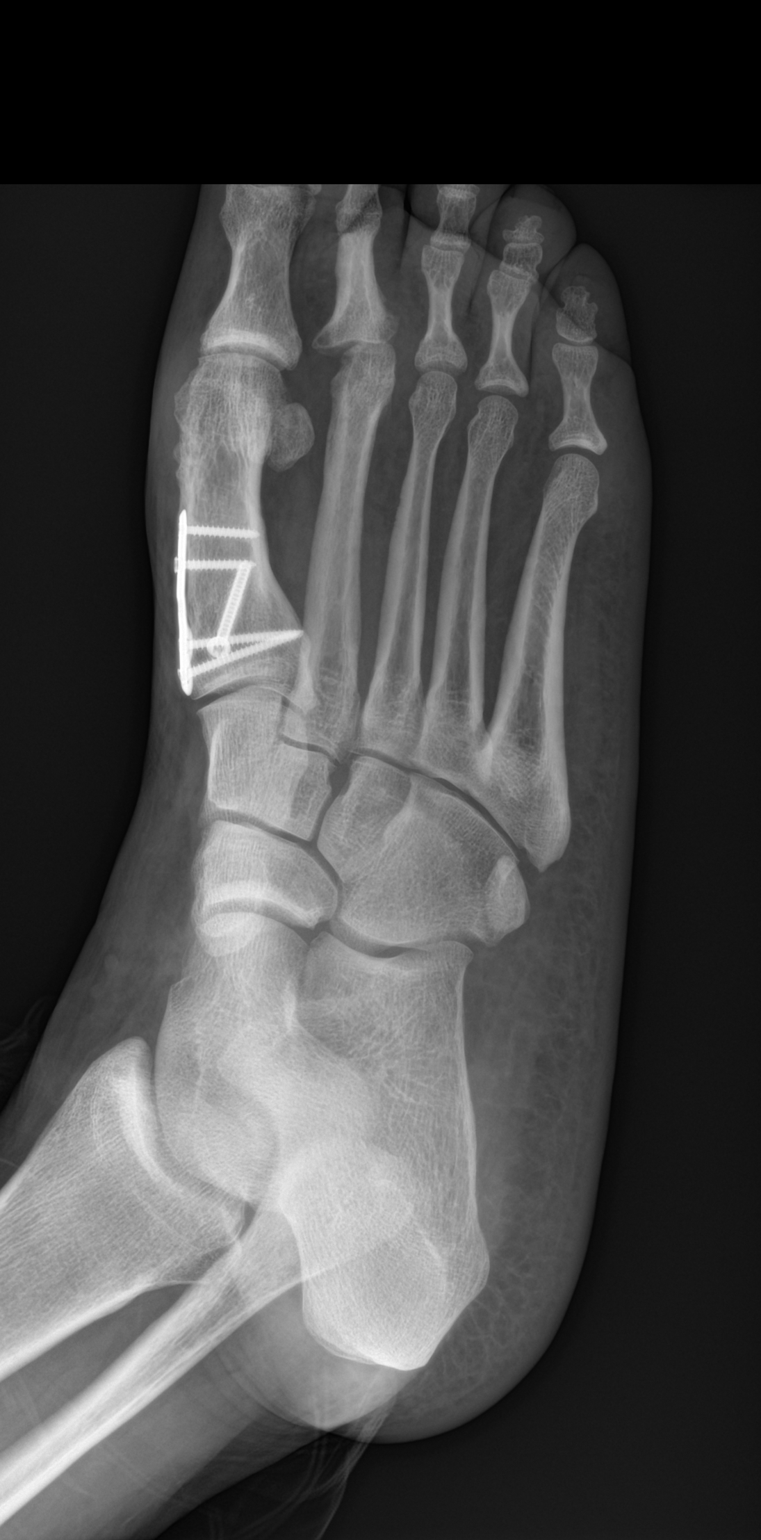
[im 2/2]
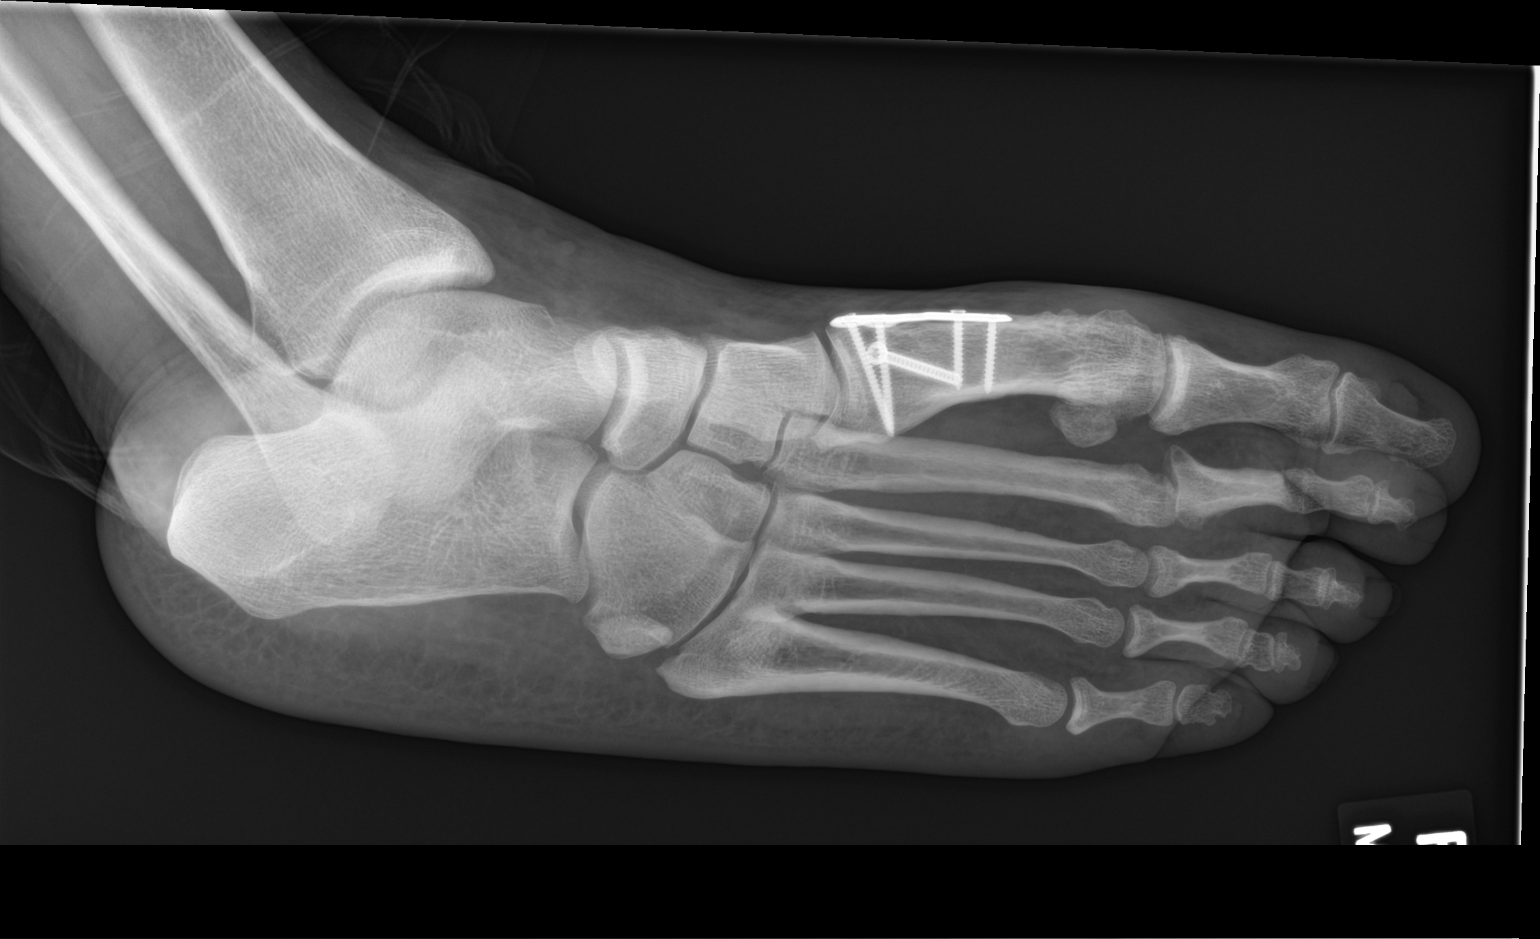

[foot lat]
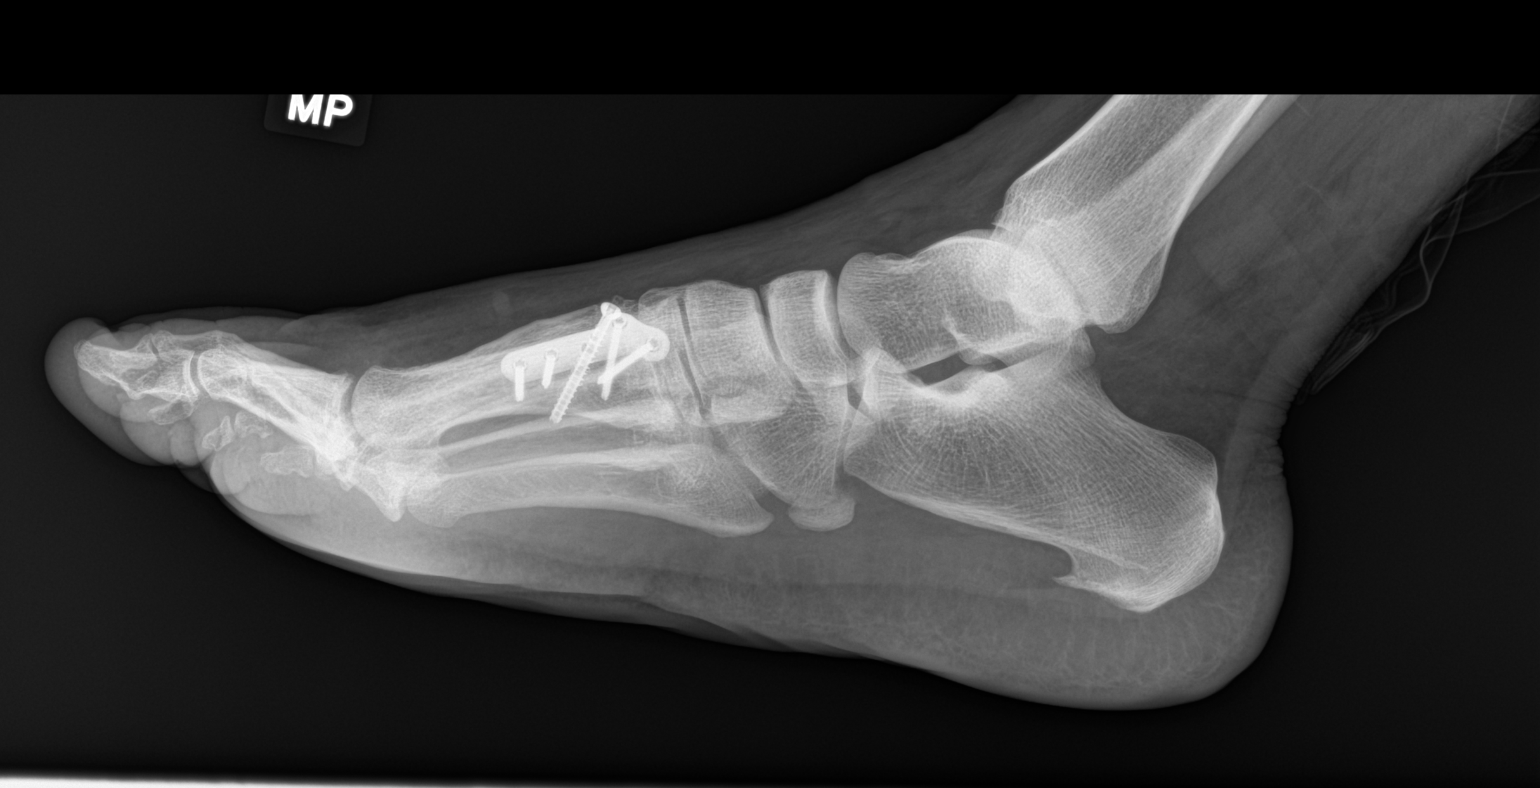

[4 of 4 positions shown; findings below may reference images not displayed]

FINDINGS: Frontal, oblique, and lateral views obtained. There is postoperative
change in the proximal right first metatarsal with screw and plate
fixation in this area. Patient has had previous bunionectomy. No
acute fracture or dislocation. There is joint space narrowing at the
first and second MTP joints as well as in the second PIP joint. No
erosive change. There is an inferior calcaneal spur.
IMPRESSION: Postoperative change in the right first metatarsal. No acute
fracture or dislocation. Osteoarthritic change in the first and
second MTP joints and in the second PIP joint. No erosive changes.
Inferior calcaneal spur noted.

## 2020-11-04 ENCOUNTER — Encounter: Payer: 59 | Admitting: Family Medicine

## 2020-11-30 ENCOUNTER — Encounter: Payer: 59 | Admitting: Family Medicine

## 2020-12-16 ENCOUNTER — Encounter: Payer: 59 | Admitting: Family Medicine

## 2020-12-18 ENCOUNTER — Encounter: Payer: Self-pay | Admitting: Family Medicine

## 2020-12-31 ENCOUNTER — Ambulatory Visit (INDEPENDENT_AMBULATORY_CARE_PROVIDER_SITE_OTHER): Payer: 59 | Admitting: Family Medicine

## 2020-12-31 ENCOUNTER — Encounter: Payer: Self-pay | Admitting: Family Medicine

## 2020-12-31 ENCOUNTER — Other Ambulatory Visit: Payer: Self-pay

## 2020-12-31 VITALS — BP 112/80 | HR 64 | Temp 97.5°F | Ht 71.0 in | Wt 205.6 lb

## 2020-12-31 DIAGNOSIS — Z0001 Encounter for general adult medical examination with abnormal findings: Secondary | ICD-10-CM | POA: Diagnosis not present

## 2020-12-31 DIAGNOSIS — Z1211 Encounter for screening for malignant neoplasm of colon: Secondary | ICD-10-CM

## 2020-12-31 DIAGNOSIS — Z125 Encounter for screening for malignant neoplasm of prostate: Secondary | ICD-10-CM | POA: Diagnosis not present

## 2020-12-31 DIAGNOSIS — E559 Vitamin D deficiency, unspecified: Secondary | ICD-10-CM

## 2020-12-31 DIAGNOSIS — F321 Major depressive disorder, single episode, moderate: Secondary | ICD-10-CM | POA: Diagnosis not present

## 2020-12-31 DIAGNOSIS — Z0000 Encounter for general adult medical examination without abnormal findings: Secondary | ICD-10-CM

## 2020-12-31 MED ORDER — PANTOPRAZOLE SODIUM 40 MG PO TBEC: 40.0000 mg | DELAYED_RELEASE_TABLET | Freq: Every day | ORAL | 11 refills | Status: DC

## 2020-12-31 MED ORDER — ESCITALOPRAM OXALATE 10 MG PO TABS: 10.0000 mg | ORAL_TABLET | Freq: Every day | ORAL | 1 refills | Status: DC

## 2020-12-31 MED ORDER — TRAZODONE HCL 150 MG PO TABS: ORAL_TABLET | ORAL | 5 refills | Status: DC

## 2020-12-31 NOTE — Progress Notes (Signed)
Subjective:  Patient ID: Vincent Sandoval, male    DOB: 24-Apr-1971  Age: 50 y.o. MRN: [id number]  CC: Annual Exam, Anxiety, and Depression   HPI Vincent Sandoval presents for annual exam.  He is also having a great deal of anxiety and depression.  PHQ and GAD-7 noted below and reviewed with him.  GAD 7 : Generalized Anxiety Score 12/31/2020  Nervous, Anxious, on Edge 3  Control/stop worrying 1  Worry too much - different things 3  Trouble relaxing 3  Restless 3  Easily annoyed or irritable 1  Afraid - awful might happen 1  Total GAD 7 Score 15  Anxiety Difficulty Somewhat difficult      Depression screen [facility] 2/9 12/31/2020 12/31/2020 11/25/2019  Decreased Interest 3 0 0  Down, Depressed, Hopeless 1 0 0  PHQ - 2 Score 4 0 0  Altered sleeping 1 - -  Tired, decreased energy 2 - -  Change in appetite 3 - -  Feeling bad or failure about yourself  0 - -  Trouble concentrating 1 - -  Moving slowly or fidgety/restless 1 - -  Suicidal thoughts 0 - -  PHQ-9 Score 12 - -  Difficult doing work/chores Somewhat difficult - -   GAD 7 : Generalized Anxiety Score 12/31/2020  Nervous, Anxious, on Edge 3  Control/stop worrying 1  Worry too much - different things 3  Trouble relaxing 3  Restless 3  Easily annoyed or irritable 1  Afraid - awful might happen 1  Total GAD 7 Score 15  Anxiety Difficulty Somewhat difficult      History Vincent Sandoval has no past medical history on file.   He has a past surgical history that includes Exploratory laparotomy (Left).   His family history includes Diabetes in his brother; Hearing loss in his brother; Hypertension in his brother and brother.He reports that he has been smoking cigarettes. He has never used smokeless tobacco. He reports that he does not drink alcohol and does not use drugs.    ROS Review of Systems  Constitutional: Negative for activity change, fatigue and unexpected weight change.  HENT: Negative for congestion, ear pain, hearing  loss, postnasal drip and trouble swallowing.   Eyes: Negative for pain and visual disturbance.  Respiratory: Negative for cough, chest tightness and shortness of breath.   Cardiovascular: Negative for chest pain, palpitations and leg swelling.  Gastrointestinal: Negative for abdominal distention, abdominal pain, blood in stool, constipation, diarrhea, nausea and vomiting.  Endocrine: Negative for cold intolerance, heat intolerance and polydipsia.  Genitourinary: Negative for difficulty urinating, dysuria, flank pain, frequency and urgency.  Musculoskeletal: Negative for arthralgias and joint swelling.  Skin: Negative for color change, rash and wound.  Neurological: Negative for dizziness, syncope, speech difficulty, weakness, light-headedness, numbness and headaches.  Hematological: Does not bruise/bleed easily.  Psychiatric/Behavioral: Positive for dysphoric mood. Negative for confusion, decreased concentration and sleep disturbance. The patient is nervous/anxious.     Objective:  BP 112/80   Pulse 64   Temp (!) 97.5 F (36.4 C) (Temporal)   Ht 5' 11"  (1.803 m)   Wt 205 lb 9.6 oz (93.3 kg)   BMI 28.68 kg/m   BP Readings from Last 3 Encounters:  12/31/20 112/80  12/10/19 112/72  11/25/19 (!) 100/58    Wt Readings from Last 3 Encounters:  12/31/20 205 lb 9.6 oz (93.3 kg)  12/10/19 199 lb 3.2 oz (90.4 kg)  11/25/19 197 lb (89.4 kg)     Physical Exam Vitals  reviewed.  Constitutional:      Appearance: He is well-developed and well-nourished.  HENT:     Head: Normocephalic and atraumatic.     Right Ear: Tympanic membrane and external ear normal. No decreased hearing noted.     Left Ear: Tympanic membrane and external ear normal. No decreased hearing noted.     Mouth/Throat:     Pharynx: No oropharyngeal exudate or posterior oropharyngeal erythema.  Eyes:     Pupils: Pupils are equal, round, and reactive to light.  Cardiovascular:     Rate and Rhythm: Normal rate and  regular rhythm.     Heart sounds: No murmur heard.   Pulmonary:     Effort: No respiratory distress.     Breath sounds: Normal breath sounds.  Abdominal:     General: Bowel sounds are normal.     Palpations: Abdomen is soft. There is no mass.     Tenderness: There is no abdominal tenderness.  Musculoskeletal:     Cervical back: Normal range of motion and neck supple.       Assessment & Plan:   Vincent Sandoval was seen today for annual exam, anxiety and depression.  Diagnoses and all orders for this visit:  Well adult exam -     CBC with Differential/Platelet -     CMP14+EGFR -     Lipid panel -     VITAMIN D 25 Hydroxy (Vit-D Deficiency, Fractures)  Depression, major, single episode, moderate (HCC) -     CBC with Differential/Platelet -     CMP14+EGFR -     Lipid panel -     VITAMIN D 25 Hydroxy (Vit-D Deficiency, Fractures)  Screen for colon cancer -     Ambulatory referral to Gastroenterology  Screening for prostate cancer -     PSA, total and free  Vitamin D deficiency -     CBC with Differential/Platelet -     CMP14+EGFR -     Lipid panel -     VITAMIN D 25 Hydroxy (Vit-D Deficiency, Fractures)  Other orders -     traZODone (DESYREL) 150 MG tablet; Use from 1/3 to 1 tablet nightly as needed for sleep. -     escitalopram (LEXAPRO) 10 MG tablet; Take 1 tablet (10 mg total) by mouth daily. -     pantoprazole (PROTONIX) 40 MG tablet; Take 1 tablet (40 mg total) by mouth daily. For stomach       I am having Conception Chancy start on traZODone, escitalopram, and pantoprazole.  Allergies as of 12/31/2020   No Known Allergies     Medication List       Accurate as of December 31, 2020 11:59 PM. If you have any questions, ask your nurse or doctor.        escitalopram 10 MG tablet Commonly known as: LEXAPRO Take 1 tablet (10 mg total) by mouth daily. Started by: Claretta Fraise, MD   pantoprazole 40 MG tablet Commonly known as: PROTONIX Take 1 tablet (40 mg  total) by mouth daily. For stomach Started by: Claretta Fraise, MD   traZODone 150 MG tablet Commonly known as: DESYREL Use from 1/3 to 1 tablet nightly as needed for sleep. Started by: Claretta Fraise, MD        Follow-up: Return in about 1 month (around 01/28/2021).  Claretta Fraise, M.D.

## 2021-01-01 ENCOUNTER — Other Ambulatory Visit: Payer: Self-pay | Admitting: Family Medicine

## 2021-01-01 LAB — VITAMIN D 25 HYDROXY (VIT D DEFICIENCY, FRACTURES): Vit D, 25-Hydroxy: 6.6 ng/mL — ABNORMAL LOW (ref 30.0–100.0)

## 2021-01-01 LAB — PSA, TOTAL AND FREE
PSA, Free Pct: 62.5 %
PSA, Free: 0.25 ng/mL
Prostate Specific Ag, Serum: 0.4 ng/mL (ref 0.0–4.0)

## 2021-01-01 LAB — LIPID PANEL
Chol/HDL Ratio: 6.2 ratio — ABNORMAL HIGH (ref 0.0–5.0)
Cholesterol, Total: 212 mg/dL — ABNORMAL HIGH (ref 100–199)
HDL: 34 mg/dL — ABNORMAL LOW (ref >39)
LDL Chol Calc (NIH): 116 mg/dL — ABNORMAL HIGH (ref 0–99)
Triglycerides: 353 mg/dL — ABNORMAL HIGH (ref 0–149)
VLDL Cholesterol Cal: 62 mg/dL — ABNORMAL HIGH (ref 5–40)

## 2021-01-01 LAB — CMP14+EGFR
ALT: 37 IU/L (ref 0–44)
AST: 22 IU/L (ref 0–40)
Albumin/Globulin Ratio: 1.4 (ref 1.2–2.2)
Albumin: 4.2 g/dL (ref 4.0–5.0)
Alkaline Phosphatase: 64 IU/L (ref 44–121)
BUN/Creatinine Ratio: 12 (ref 9–20)
BUN: 11 mg/dL (ref 6–24)
Bilirubin Total: 0.3 mg/dL (ref 0.0–1.2)
CO2: 19 mmol/L — ABNORMAL LOW (ref 20–29)
Calcium: 9.1 mg/dL (ref 8.7–10.2)
Chloride: 105 mmol/L (ref 96–106)
Creatinine, Ser: 0.91 mg/dL (ref 0.76–1.27)
GFR calc Af Amer: 113 mL/min/{1.73_m2} (ref >59)
GFR calc non Af Amer: 98 mL/min/{1.73_m2} (ref >59)
Globulin, Total: 2.9 g/dL (ref 1.5–4.5)
Glucose: 93 mg/dL (ref 65–99)
Potassium: 4.1 mmol/L (ref 3.5–5.2)
Sodium: 140 mmol/L (ref 134–144)
Total Protein: 7.1 g/dL (ref 6.0–8.5)

## 2021-01-01 LAB — CBC WITH DIFFERENTIAL/PLATELET
Basophils Absolute: 0.1 10*3/uL (ref 0.0–0.2)
Basos: 1 %
EOS (ABSOLUTE): 0.2 10*3/uL (ref 0.0–0.4)
Eos: 2 %
Hematocrit: 42.9 % (ref 37.5–51.0)
Hemoglobin: 15 g/dL (ref 13.0–17.7)
Immature Grans (Abs): 0 10*3/uL (ref 0.0–0.1)
Immature Granulocytes: 0 %
Lymphocytes Absolute: 3.4 10*3/uL — ABNORMAL HIGH (ref 0.7–3.1)
Lymphs: 47 %
MCH: 30.9 pg (ref 26.6–33.0)
MCHC: 35 g/dL (ref 31.5–35.7)
MCV: 89 fL (ref 79–97)
Monocytes Absolute: 0.7 10*3/uL (ref 0.1–0.9)
Monocytes: 10 %
Neutrophils Absolute: 3 10*3/uL (ref 1.4–7.0)
Neutrophils: 40 %
Platelets: 248 10*3/uL (ref 150–450)
RBC: 4.85 x10E6/uL (ref 4.14–5.80)
RDW: 12.6 % (ref 11.6–15.4)
WBC: 7.4 10*3/uL (ref 3.4–10.8)

## 2021-01-01 MED ORDER — FENOFIBRATE 160 MG PO TABS: 160.0000 mg | ORAL_TABLET | Freq: Every day | ORAL | 3 refills | Status: DC

## 2021-01-04 ENCOUNTER — Encounter: Payer: Self-pay | Admitting: Family Medicine

## 2021-02-02 ENCOUNTER — Other Ambulatory Visit: Payer: Self-pay

## 2021-02-02 ENCOUNTER — Encounter: Payer: Self-pay | Admitting: Family Medicine

## 2021-02-02 ENCOUNTER — Ambulatory Visit (INDEPENDENT_AMBULATORY_CARE_PROVIDER_SITE_OTHER): Payer: 59 | Admitting: Family Medicine

## 2021-02-02 VITALS — BP 92/50 | HR 73 | Temp 97.5°F | Ht 71.0 in | Wt 203.2 lb

## 2021-02-02 DIAGNOSIS — K2100 Gastro-esophageal reflux disease with esophagitis, without bleeding: Secondary | ICD-10-CM

## 2021-02-02 DIAGNOSIS — F321 Major depressive disorder, single episode, moderate: Secondary | ICD-10-CM

## 2021-02-02 NOTE — Progress Notes (Signed)
Subjective:  Patient ID: Vincent Sandoval, male    DOB: 08-Jun-1971  Age: 50 y.o. MRN: [id number]  CC: Follow-up (mood)   HPI Vincent Sandoval presents for recheck of his anxiety and depression. Doing better. Still some anxiety, worry. Getting somewhat groggy in the mornings with just a 1/3 of a trazodone. Not taking the lexapro regularly because he drinks beer and didn't want to mix it with his meds.  Patient in for follow-up of GERD. Currently asymptomatic taking  PPI daily. There is no chest pain or heartburn. No hematemesis and no melena. No dysphagia or choking. Onset is remote. Progression is stable. Complicating factors, none.     GAD 7 : Generalized Anxiety Score 12/31/2020  Nervous, Anxious, on Edge 3  Control/stop worrying 1  Worry too much - different things 3  Trouble relaxing 3  Restless 3  Easily annoyed or irritable 1  Afraid - awful might happen 1  Total GAD 7 Score 15  Anxiety Difficulty Somewhat difficult      Depression screen [facility] 2/9 12/31/2020 12/31/2020 11/25/2019  Decreased Interest 3 0 0  Down, Depressed, Hopeless 1 0 0  PHQ - 2 Score 4 0 0  Altered sleeping 1 - -  Tired, decreased energy 2 - -  Change in appetite 3 - -  Feeling bad or failure about yourself  0 - -  Trouble concentrating 1 - -  Moving slowly or fidgety/restless 1 - -  Suicidal thoughts 0 - -  PHQ-9 Score 12 - -  Difficult doing work/chores Somewhat difficult - -    History Eithan has no past medical history on file.   He has a past surgical history that includes Exploratory laparotomy (Left).   His family history includes Diabetes in his brother; Hearing loss in his brother; Hypertension in his brother and brother.He reports that he has been smoking cigarettes. He has never used smokeless tobacco. He reports that he does not drink alcohol and does not use drugs.    ROS Review of Systems  Constitutional: Negative for fever.  Respiratory: Negative for shortness of breath.    Cardiovascular: Negative for chest pain.  Musculoskeletal: Negative for arthralgias.  Skin: Negative for rash.    Objective:  BP (!) 92/50   Pulse 73   Temp (!) 97.5 F (36.4 C)   Ht 5\' 11"  (1.803 m)   Wt 203 lb 3.2 oz (92.2 kg)   SpO2 98%   BMI 28.34 kg/m   BP Readings from Last 3 Encounters:  02/02/21 (!) 92/50  12/31/20 112/80  12/10/19 112/72    Wt Readings from Last 3 Encounters:  02/02/21 203 lb 3.2 oz (92.2 kg)  12/31/20 205 lb 9.6 oz (93.3 kg)  12/10/19 199 lb 3.2 oz (90.4 kg)     Physical Exam Vitals reviewed.  Constitutional:      Appearance: He is well-developed.  HENT:     Head: Normocephalic and atraumatic.     Right Ear: External ear normal.     Left Ear: External ear normal.     Mouth/Throat:     Pharynx: No oropharyngeal exudate or posterior oropharyngeal erythema.  Eyes:     Pupils: Pupils are equal, round, and reactive to light.  Cardiovascular:     Rate and Rhythm: Normal rate and regular rhythm.     Heart sounds: No murmur heard.   Pulmonary:     Effort: No respiratory distress.     Breath sounds: Normal breath sounds.  Musculoskeletal:  Cervical back: Normal range of motion and neck supple.  Neurological:     Mental Status: He is alert and oriented to person, place, and time.       Assessment & Plan:   Creig was seen today for follow-up.  Diagnoses and all orders for this visit:  Depression, major, single episode, moderate (HCC)  Gastroesophageal reflux disease with esophagitis without hemorrhage       I am having Penelope Galas maintain his traZODone, escitalopram, pantoprazole, and fenofibrate.  Allergies as of 02/02/2021   No Known Allergies     Medication List       Accurate as of February 02, 2021 10:16 PM. If you have any questions, ask your nurse or doctor.        escitalopram 10 MG tablet Commonly known as: LEXAPRO Take 1 tablet (10 mg total) by mouth daily.   fenofibrate 160 MG tablet Take 1  tablet (160 mg total) by mouth daily. For cholesterol and triglyceride   pantoprazole 40 MG tablet Commonly known as: PROTONIX Take 1 tablet (40 mg total) by mouth daily. For stomach   traZODone 150 MG tablet Commonly known as: DESYREL Use from 1/3 to 1 tablet nightly as needed for sleep.        Follow-up: Return in about 1 month (around 03/05/2021).  Mechele Claude, M.D.

## 2021-03-10 ENCOUNTER — Ambulatory Visit: Payer: 59 | Admitting: Family Medicine

## 2021-03-16 ENCOUNTER — Encounter: Payer: Self-pay | Admitting: Family Medicine

## 2021-03-16 ENCOUNTER — Ambulatory Visit (INDEPENDENT_AMBULATORY_CARE_PROVIDER_SITE_OTHER): Payer: 59 | Admitting: Family Medicine

## 2021-03-16 ENCOUNTER — Other Ambulatory Visit: Payer: Self-pay

## 2021-03-16 VITALS — BP 106/74 | HR 83 | Temp 97.1°F | Ht 71.0 in | Wt 203.2 lb

## 2021-03-16 DIAGNOSIS — F321 Major depressive disorder, single episode, moderate: Secondary | ICD-10-CM

## 2021-03-16 MED ORDER — ESCITALOPRAM OXALATE 20 MG PO TABS: 20.0000 mg | ORAL_TABLET | Freq: Every day | ORAL | 1 refills | Status: DC

## 2021-03-16 MED ORDER — PANTOPRAZOLE SODIUM 40 MG PO TBEC: 40.0000 mg | DELAYED_RELEASE_TABLET | Freq: Every day | ORAL | 3 refills | Status: DC

## 2021-03-16 MED ORDER — QUETIAPINE FUMARATE 25 MG PO TABS: 25.0000 mg | ORAL_TABLET | Freq: Every day | ORAL | 1 refills | Status: DC

## 2021-03-16 NOTE — Progress Notes (Signed)
Subjective:  Patient ID: Vincent Sandoval, male    DOB: 02-17-1971  Age: 50 y.o. MRN: [id number]  CC: Depression   HPI Vincent Sandoval presents for recheck on depression. About to change jobs. Getting better hours  Feels better about this. Meds helping. Denies side effects. PHQ reviewed as below.  Depression screen [facility] 2/9 03/16/2021 12/31/2020 12/31/2020  Decreased Interest 0 3 0  Down, Depressed, Hopeless 1 1 0  PHQ - 2 Score 1 4 0  Altered sleeping 1 1 -  Tired, decreased energy 2 2 -  Change in appetite 0 3 -  Feeling bad or failure about yourself  0 0 -  Trouble concentrating 1 1 -  Moving slowly or fidgety/restless 0 1 -  Suicidal thoughts 0 0 -  PHQ-9 Score 5 12 -  Difficult doing work/chores Somewhat difficult Somewhat difficult -    History Vincent Sandoval has a past medical history of Depression, major, single episode, moderate (HCC) (03/18/2021).   Vincent Sandoval has a past surgical history that includes Exploratory laparotomy (Left).   His family history includes Diabetes in his brother; Hearing loss in his brother; Hypertension in his brother and brother.Vincent Sandoval reports that Vincent Sandoval has been smoking cigarettes. Vincent Sandoval has never used smokeless tobacco. Vincent Sandoval reports that Vincent Sandoval does not drink alcohol and does not use drugs.    ROS Review of Systems  Constitutional: Negative for fever.  Respiratory: Negative for shortness of breath.   Cardiovascular: Negative for chest pain.  Musculoskeletal: Negative for arthralgias.  Skin: Negative for rash.    Objective:  BP 106/74   Pulse 83   Temp (!) 97.1 F (36.2 C)   Ht 5\' 11"  (1.803 m)   Wt 203 lb 3.2 oz (92.2 kg)   SpO2 97%   BMI 28.34 kg/m   BP Readings from Last 3 Encounters:  03/16/21 106/74  02/02/21 (!) 92/50  12/31/20 112/80    Wt Readings from Last 3 Encounters:  03/16/21 203 lb 3.2 oz (92.2 kg)  02/02/21 203 lb 3.2 oz (92.2 kg)  12/31/20 205 lb 9.6 oz (93.3 kg)     Physical Exam Vitals reviewed.  Constitutional:      Appearance: Vincent Sandoval  is well-developed.  HENT:     Head: Normocephalic and atraumatic.     Right Ear: External ear normal.     Left Ear: External ear normal.     Mouth/Throat:     Pharynx: No oropharyngeal exudate or posterior oropharyngeal erythema.  Eyes:     Pupils: Pupils are equal, round, and reactive to light.  Cardiovascular:     Rate and Rhythm: Normal rate and regular rhythm.     Heart sounds: No murmur heard.   Pulmonary:     Effort: No respiratory distress.     Breath sounds: Normal breath sounds.  Musculoskeletal:     Cervical back: Normal range of motion and neck supple.  Neurological:     Mental Status: Vincent Sandoval is alert and oriented to person, place, and time.       Assessment & Plan:   Vincent Sandoval was seen today for depression.  Diagnoses and all orders for this visit:  Depression, major, single episode, moderate (HCC)  Other orders -     escitalopram (LEXAPRO) 20 MG tablet; Take 1 tablet (20 mg total) by mouth daily. -     pantoprazole (PROTONIX) 40 MG tablet; Take 1 tablet (40 mg total) by mouth daily. For stomach -     QUEtiapine (SEROQUEL) 25 MG tablet;  Take 1 tablet (25 mg total) by mouth at bedtime.       I have discontinued Payton Doughty. Guinther's traZODone. I am also having him start on escitalopram and QUEtiapine. Additionally, I am having him maintain his escitalopram, fenofibrate, and pantoprazole.  Allergies as of 03/16/2021   No Known Allergies     Medication List       Accurate as of March 16, 2021 11:59 PM. If you have any questions, ask your nurse or doctor.        STOP taking these medications   traZODone 150 MG tablet Commonly known as: DESYREL Stopped by: Mechele Claude, MD     TAKE these medications   escitalopram 10 MG tablet Commonly known as: LEXAPRO Take 1 tablet (10 mg total) by mouth daily. What changed: Another medication with the same name was added. Make sure you understand how and when to take each. Changed by: Mechele Claude, MD    escitalopram 20 MG tablet Commonly known as: LEXAPRO Take 1 tablet (20 mg total) by mouth daily. What changed: You were already taking a medication with the same name, and this prescription was added. Make sure you understand how and when to take each. Changed by: Mechele Claude, MD   fenofibrate 160 MG tablet Take 1 tablet (160 mg total) by mouth daily. For cholesterol and triglyceride   pantoprazole 40 MG tablet Commonly known as: PROTONIX Take 1 tablet (40 mg total) by mouth daily. For stomach   QUEtiapine 25 MG tablet Commonly known as: SEROQUEL Take 1 tablet (25 mg total) by mouth at bedtime. Started by: Mechele Claude, MD        Follow-up: Return in about 3 months (around 06/15/2021).  Mechele Claude, M.D.

## 2021-03-18 ENCOUNTER — Encounter: Payer: Self-pay | Admitting: Family Medicine

## 2021-03-18 DIAGNOSIS — F321 Major depressive disorder, single episode, moderate: Secondary | ICD-10-CM

## 2021-03-18 HISTORY — DX: Major depressive disorder, single episode, moderate: F32.1

## 2021-06-14 ENCOUNTER — Ambulatory Visit: Payer: 59 | Admitting: Family Medicine

## 2021-06-25 ENCOUNTER — Encounter: Payer: Self-pay | Admitting: Family Medicine

## 2021-06-25 ENCOUNTER — Ambulatory Visit (INDEPENDENT_AMBULATORY_CARE_PROVIDER_SITE_OTHER): Payer: Self-pay | Admitting: Family Medicine

## 2021-06-25 ENCOUNTER — Other Ambulatory Visit: Payer: Self-pay

## 2021-06-25 VITALS — BP 117/72 | HR 80 | Temp 97.3°F | Ht 71.0 in | Wt 206.2 lb

## 2021-06-25 DIAGNOSIS — K2100 Gastro-esophageal reflux disease with esophagitis, without bleeding: Secondary | ICD-10-CM | POA: Insufficient documentation

## 2021-06-25 DIAGNOSIS — F321 Major depressive disorder, single episode, moderate: Secondary | ICD-10-CM

## 2021-06-25 MED ORDER — QUETIAPINE FUMARATE 25 MG PO TABS: 25.0000 mg | ORAL_TABLET | Freq: Every day | ORAL | 3 refills | Status: DC

## 2021-06-25 MED ORDER — ESCITALOPRAM OXALATE 20 MG PO TABS: 20.0000 mg | ORAL_TABLET | Freq: Every day | ORAL | 3 refills | Status: DC

## 2021-06-25 NOTE — Progress Notes (Signed)
Subjective:  Patient ID: Vincent Sandoval, male    DOB: 1971/11/14  Age: 50 y.o. MRN: [id number]  CC: Medical Management of Chronic Issues   HPI Vincent Sandoval presents for recheck of anxiety and depreeion. Much improved.  He left his job and is living on his savings.  He has enough money saved to live on for a while.  He says his new job may be starting within a week.  He is much less stressed.  Patient in for follow-up of GERD. Currently asymptomatic taking  PPI daily. There is no chest pain or heartburn. No hematemesis and no melena. No dysphagia or choking. Onset is remote. Progression is stable. Complicating factors, none. He is currently taking fenofibrate for elevated cholesterol and triglycerides without any side effects in the been noted.  Depression screen [facility] 2/9 06/25/2021 03/16/2021 12/31/2020  Decreased Interest 0 0 3  Down, Depressed, Hopeless 0 1 1  PHQ - 2 Score 0 1 4  Altered sleeping - 1 1  Tired, decreased energy - 2 2  Change in appetite - 0 3  Feeling bad or failure about yourself  - 0 0  Trouble concentrating - 1 1  Moving slowly or fidgety/restless - 0 1  Suicidal thoughts - 0 0  PHQ-9 Score - 5 12  Difficult doing work/chores - Somewhat difficult Somewhat difficult    History Vincent Sandoval has a past medical history of Depression, major, single episode, moderate (HCC) (03/18/2021).   He has a past surgical history that includes Exploratory laparotomy (Left).   His family history includes Diabetes in his brother; Hearing loss in his brother; Hypertension in his brother and brother.He reports that he has been smoking cigarettes. He has never used smokeless tobacco. He reports that he does not drink alcohol and does not use drugs.    ROS Review of Systems NOncontrbutory Objective:  BP 117/72   Pulse 80   Temp (!) 97.3 F (36.3 C)   Ht 5\' 11"  (1.803 m)   Wt 206 lb 3.2 oz (93.5 kg)   SpO2 96%   BMI 28.76 kg/m   BP Readings from Last 3 Encounters:  06/25/21  117/72  03/16/21 106/74  02/02/21 (!) 92/50    Wt Readings from Last 3 Encounters:  06/25/21 206 lb 3.2 oz (93.5 kg)  03/16/21 203 lb 3.2 oz (92.2 kg)  02/02/21 203 lb 3.2 oz (92.2 kg)     Physical Exam Vitals reviewed.  Constitutional:      Appearance: He is well-developed.  HENT:     Head: Normocephalic and atraumatic.     Right Ear: External ear normal.     Left Ear: External ear normal.     Mouth/Throat:     Pharynx: No oropharyngeal exudate or posterior oropharyngeal erythema.  Eyes:     Pupils: Pupils are equal, round, and reactive to light.  Cardiovascular:     Rate and Rhythm: Normal rate and regular rhythm.     Heart sounds: No murmur heard. Pulmonary:     Effort: No respiratory distress.     Breath sounds: Normal breath sounds.  Musculoskeletal:     Cervical back: Normal range of motion and neck supple.  Neurological:     Mental Status: He is alert and oriented to person, place, and time.      Assessment & Plan:   Vincent Sandoval was seen today for medical management of chronic issues.  Diagnoses and all orders for this visit:  Gastroesophageal reflux disease with esophagitis  without hemorrhage  Depression, major, single episode, moderate (HCC)  Other orders -     escitalopram (LEXAPRO) 20 MG tablet; Take 1 tablet (20 mg total) by mouth daily. -     QUEtiapine (SEROQUEL) 25 MG tablet; Take 1 tablet (25 mg total) by mouth at bedtime.      I am having Vincent Sandoval maintain his fenofibrate, pantoprazole, escitalopram, and QUEtiapine.  Allergies as of 06/25/2021   No Known Allergies      Medication List        Accurate as of June 25, 2021  5:03 PM. If you have any questions, ask your nurse or doctor.          escitalopram 20 MG tablet Commonly known as: LEXAPRO Take 1 tablet (20 mg total) by mouth daily. What changed: Another medication with the same name was removed. Continue taking this medication, and follow the directions you see  here. Changed by: Mechele Claude, MD   fenofibrate 160 MG tablet Take 1 tablet (160 mg total) by mouth daily. For cholesterol and triglyceride   pantoprazole 40 MG tablet Commonly known as: PROTONIX Take 1 tablet (40 mg total) by mouth daily. For stomach   QUEtiapine 25 MG tablet Commonly known as: SEROQUEL Take 1 tablet (25 mg total) by mouth at bedtime.         Follow-up: Return in about 6 months (around 12/26/2021) for [facility] physical.  Mechele Claude, M.D.

## 2021-12-27 ENCOUNTER — Ambulatory Visit (INDEPENDENT_AMBULATORY_CARE_PROVIDER_SITE_OTHER): Payer: No Typology Code available for payment source | Admitting: Family Medicine

## 2021-12-27 ENCOUNTER — Encounter: Payer: Self-pay | Admitting: Family Medicine

## 2021-12-27 VITALS — BP 125/76 | HR 77 | Temp 97.8°F | Ht 71.0 in | Wt 207.8 lb

## 2021-12-27 DIAGNOSIS — E559 Vitamin D deficiency, unspecified: Secondary | ICD-10-CM

## 2021-12-27 DIAGNOSIS — Z125 Encounter for screening for malignant neoplasm of prostate: Secondary | ICD-10-CM | POA: Diagnosis not present

## 2021-12-27 DIAGNOSIS — E785 Hyperlipidemia, unspecified: Secondary | ICD-10-CM | POA: Diagnosis not present

## 2021-12-27 MED ORDER — QUETIAPINE FUMARATE 25 MG PO TABS: 25.0000 mg | ORAL_TABLET | Freq: Every day | ORAL | 3 refills | Status: DC

## 2021-12-27 MED ORDER — FENOFIBRATE 160 MG PO TABS: 160.0000 mg | ORAL_TABLET | Freq: Every day | ORAL | 3 refills | Status: DC

## 2021-12-27 MED ORDER — PANTOPRAZOLE SODIUM 40 MG PO TBEC: 40.0000 mg | DELAYED_RELEASE_TABLET | Freq: Every day | ORAL | 2 refills | Status: DC

## 2021-12-27 NOTE — Progress Notes (Signed)
Subjective:  Patient ID: Vincent Sandoval, male    DOB: [birth year]  Age: 51 y.o. MRN: [id number]  CC: Medical Management of Chronic Issues   HPI Vincent Sandoval presents for recheck of depression  and anxiety. If he misses a Lexapro he will feel anxious. Not taking the seroquel often because it cost $80.   Depression screen [facility] 2/9 12/27/2021 06/25/2021 03/16/2021  Decreased Interest 0 0 0  Down, Depressed, Hopeless 0 0 1  PHQ - 2 Score 0 0 1  Altered sleeping - - 1  Tired, decreased energy - - 2  Change in appetite - - 0  Feeling bad or failure about yourself  - - 0  Trouble concentrating - - 1  Moving slowly or fidgety/restless - - 0  Suicidal thoughts - - 0  PHQ-9 Score - - 5  Difficult doing work/chores - - Somewhat difficult    History Vincent Sandoval has a past medical history of Depression, major, single episode, moderate (Vincent Sandoval) (03/18/2021).   He has a past surgical history that includes Exploratory laparotomy (Left).   His family history includes Diabetes in his brother; Hearing loss in his brother; Hypertension in his brother and brother.He reports that he has been smoking cigarettes. He has never used smokeless tobacco. He reports that he does not drink alcohol and does not use drugs.    ROS Review of Systems  Constitutional:  Negative for fever.  Respiratory:  Negative for shortness of breath.   Cardiovascular:  Negative for chest pain.  Musculoskeletal:  Negative for arthralgias.  Skin:  Negative for rash.   Objective:  BP 125/76    Pulse 77    Temp 97.8 F (36.6 C)    Ht 5' 11" (1.803 m)    Wt 207 lb 12.8 oz (94.3 kg)    SpO2 98%    BMI 28.98 kg/m   BP Readings from Last 3 Encounters:  12/27/21 125/76  06/25/21 117/72  03/16/21 106/74    Wt Readings from Last 3 Encounters:  12/27/21 207 lb 12.8 oz (94.3 kg)  06/25/21 206 lb 3.2 oz (93.5 kg)  03/16/21 203 lb 3.2 oz (92.2 kg)     Physical Exam Vitals reviewed.  Constitutional:      Appearance: He is  well-developed.  HENT:     Head: Normocephalic and atraumatic.     Right Ear: External ear normal.     Left Ear: External ear normal.     Mouth/Throat:     Pharynx: No oropharyngeal exudate or posterior oropharyngeal erythema.  Eyes:     Pupils: Pupils are equal, round, and reactive to light.  Cardiovascular:     Rate and Rhythm: Normal rate and regular rhythm.     Heart sounds: No murmur heard. Pulmonary:     Effort: No respiratory distress.     Breath sounds: Normal breath sounds.  Musculoskeletal:     Cervical back: Normal range of motion and neck supple.  Neurological:     Mental Status: He is alert and oriented to person, place, and time.      Assessment & Plan:   Dimarco was seen today for medical management of chronic issues.  Diagnoses and all orders for this visit:  Hyperlipidemia, unspecified hyperlipidemia type -     CBC with Differential/Platelet -     CMP14+EGFR -     Lipid panel  Screening for prostate cancer -     PSA, total and free  Vitamin D deficiency -  VITAMIN D 25 Hydroxy (Vit-D Deficiency, Fractures)  Other orders -     fenofibrate 160 MG tablet; Take 1 tablet (160 mg total) by mouth daily. For cholesterol and triglyceride -     pantoprazole (PROTONIX) 40 MG tablet; Take 1 tablet (40 mg total) by mouth daily. For stomach -     Discontinue: QUEtiapine (SEROQUEL) 25 MG tablet; Take 1 tablet (25 mg total) by mouth at bedtime.       I have discontinued Vincent Sandoval's QUEtiapine and QUEtiapine. I am also having him maintain his escitalopram, fenofibrate, and pantoprazole.  Allergies as of 12/27/2021   No Known Allergies      Medication List        Accurate as of December 27, 2021  5:49 PM. If you have any questions, ask your nurse or doctor.          STOP taking these medications    QUEtiapine 25 MG tablet Commonly known as: SEROQUEL Stopped by: Claretta Fraise, MD       TAKE these medications    escitalopram 20 MG  tablet Commonly known as: LEXAPRO Take 1 tablet (20 mg total) by mouth daily.   fenofibrate 160 MG tablet Take 1 tablet (160 mg total) by mouth daily. For cholesterol and triglyceride   pantoprazole 40 MG tablet Commonly known as: PROTONIX Take 1 tablet (40 mg total) by mouth daily. For stomach         Follow-up: Return in about 6 months (around 06/26/2022).  Claretta Fraise, M.D.

## 2021-12-28 LAB — CMP14+EGFR
ALT: 19 IU/L (ref 0–44)
AST: 17 IU/L (ref 0–40)
Albumin/Globulin Ratio: 1.9 (ref 1.2–2.2)
Albumin: 4.6 g/dL (ref 3.8–4.9)
Alkaline Phosphatase: 72 IU/L (ref 44–121)
BUN/Creatinine Ratio: 14 (ref 9–20)
BUN: 16 mg/dL (ref 6–24)
Bilirubin Total: <0.2 mg/dL (ref 0.0–1.2)
CO2: 20 mmol/L (ref 20–29)
Calcium: 9.8 mg/dL (ref 8.7–10.2)
Chloride: 105 mmol/L (ref 96–106)
Creatinine, Ser: 1.11 mg/dL (ref 0.76–1.27)
Globulin, Total: 2.4 g/dL (ref 1.5–4.5)
Glucose: 101 mg/dL — ABNORMAL HIGH (ref 70–99)
Potassium: 4.3 mmol/L (ref 3.5–5.2)
Sodium: 141 mmol/L (ref 134–144)
Total Protein: 7 g/dL (ref 6.0–8.5)
eGFR: 80 mL/min/{1.73_m2} (ref >59)

## 2021-12-28 LAB — LIPID PANEL
Chol/HDL Ratio: 4.1 ratio (ref 0.0–5.0)
Cholesterol, Total: 204 mg/dL — ABNORMAL HIGH (ref 100–199)
HDL: 50 mg/dL (ref >39)
LDL Chol Calc (NIH): 119 mg/dL — ABNORMAL HIGH (ref 0–99)
Triglycerides: 198 mg/dL — ABNORMAL HIGH (ref 0–149)
VLDL Cholesterol Cal: 35 mg/dL (ref 5–40)

## 2021-12-28 LAB — CBC WITH DIFFERENTIAL/PLATELET
Basophils Absolute: 0.1 10*3/uL (ref 0.0–0.2)
Basos: 1 %
EOS (ABSOLUTE): 0.2 10*3/uL (ref 0.0–0.4)
Eos: 2 %
Hematocrit: 41.6 % (ref 37.5–51.0)
Hemoglobin: 14.5 g/dL (ref 13.0–17.7)
Immature Grans (Abs): 0 10*3/uL (ref 0.0–0.1)
Immature Granulocytes: 1 %
Lymphocytes Absolute: 2.5 10*3/uL (ref 0.7–3.1)
Lymphs: 30 %
MCH: 30.9 pg (ref 26.6–33.0)
MCHC: 34.9 g/dL (ref 31.5–35.7)
MCV: 89 fL (ref 79–97)
Monocytes Absolute: 0.7 10*3/uL (ref 0.1–0.9)
Monocytes: 9 %
Neutrophils Absolute: 4.8 10*3/uL (ref 1.4–7.0)
Neutrophils: 57 %
Platelets: 300 10*3/uL (ref 150–450)
RBC: 4.7 x10E6/uL (ref 4.14–5.80)
RDW: 12.8 % (ref 11.6–15.4)
WBC: 8.3 10*3/uL (ref 3.4–10.8)

## 2021-12-28 LAB — PSA, TOTAL AND FREE
PSA, Free Pct: 51.7 %
PSA, Free: 0.31 ng/mL
Prostate Specific Ag, Serum: 0.6 ng/mL (ref 0.0–4.0)

## 2021-12-28 LAB — VITAMIN D 25 HYDROXY (VIT D DEFICIENCY, FRACTURES): Vit D, 25-Hydroxy: 7.8 ng/mL — ABNORMAL LOW (ref 30.0–100.0)

## 2022-01-07 ENCOUNTER — Telehealth: Payer: Self-pay | Admitting: Family Medicine

## 2022-01-10 ENCOUNTER — Encounter: Payer: Self-pay | Admitting: *Deleted

## 2022-06-27 ENCOUNTER — Ambulatory Visit: Payer: No Typology Code available for payment source | Admitting: Family Medicine

## 2022-08-08 ENCOUNTER — Ambulatory Visit: Payer: No Typology Code available for payment source | Admitting: Family Medicine

## 2022-08-31 ENCOUNTER — Ambulatory Visit (INDEPENDENT_AMBULATORY_CARE_PROVIDER_SITE_OTHER): Payer: Managed Care, Other (non HMO) | Admitting: Family Medicine

## 2022-08-31 ENCOUNTER — Encounter: Payer: Self-pay | Admitting: Family Medicine

## 2022-08-31 VITALS — BP 106/66 | HR 89 | Temp 97.7°F | Ht 71.0 in | Wt 203.2 lb

## 2022-08-31 DIAGNOSIS — F321 Major depressive disorder, single episode, moderate: Secondary | ICD-10-CM | POA: Diagnosis not present

## 2022-08-31 DIAGNOSIS — E785 Hyperlipidemia, unspecified: Secondary | ICD-10-CM | POA: Insufficient documentation

## 2022-08-31 DIAGNOSIS — K2100 Gastro-esophageal reflux disease with esophagitis, without bleeding: Secondary | ICD-10-CM | POA: Diagnosis not present

## 2022-08-31 MED ORDER — PANTOPRAZOLE SODIUM 40 MG PO TBEC: 40.0000 mg | DELAYED_RELEASE_TABLET | Freq: Every day | ORAL | 2 refills | Status: DC

## 2022-08-31 MED ORDER — ESCITALOPRAM OXALATE 20 MG PO TABS: 20.0000 mg | ORAL_TABLET | Freq: Every day | ORAL | 3 refills | Status: DC

## 2022-08-31 NOTE — Progress Notes (Signed)
Subjective:  Patient ID: Vincent Sandoval, male    DOB: 1971-03-24  Age: 51 y.o. MRN: [id number]  CC: Medical Management of Chronic Issues   HPI HARDING THOMURE presents for follow-up of elevated cholesterol. Doing well without complaints on current medication. Denies side effects of fenofibrate including myalgia and arthralgia and nausea. Also in today for liver function testing. Currently no chest pain, shortness of breath or other cardiovascular related symptoms noted.  Patient in for follow-up of GERD. Currently asymptomatic taking  PPI daily. There is no chest pain or heartburn. No hematemesis and no melena. No dysphagia or choking. Onset is remote. Progression is stable. Complicating factors, none.   Problems with relationship with girlfriend. She drinks. They broke up. Also has been off med due to loss of insurance. Feeling down, depressed. Raising 11 & 5 yr. Old sons alone. Felt better when taking the lexapro.      08/31/2022   11:18 AM 08/31/2022   11:07 AM 12/27/2021    4:17 PM 06/25/2021    4:08 PM 03/16/2021    1:57 PM  Depression screen PHQ 2/9  Decreased Interest 0 0 0 0 0  Down, Depressed, Hopeless 0 0 0 0 1  PHQ - 2 Score 0 0 0 0 1  Altered sleeping     1  Tired, decreased energy     2  Change in appetite     0  Feeling bad or failure about yourself      0  Trouble concentrating     1  Moving slowly or fidgety/restless     0  Suicidal thoughts     0  PHQ-9 Score     5  Difficult doing work/chores     Somewhat difficult    History Xylon has a past medical history of Depression, major, single episode, moderate ([place]) (03/18/2021) and Hyperlipidemia (08/31/2022).   He has a past surgical history that includes Exploratory laparotomy (Left).   His family history includes Diabetes in his brother; Hearing loss in his brother; Hypertension in his brother and brother.He reports that he has been smoking cigarettes. He has never used smokeless tobacco. He reports that he does  not drink alcohol and does not use drugs.  Current Outpatient Medications on File Prior to Visit  Medication Sig Dispense Refill   fenofibrate 160 MG tablet Take 1 tablet (160 mg total) by mouth daily. For cholesterol and triglyceride 90 tablet 3   pantoprazole (PROTONIX) 40 MG tablet Take 1 tablet (40 mg total) by mouth daily. For stomach 90 tablet 2   No current facility-administered medications on file prior to visit.    ROS Review of Systems  Constitutional:  Negative for fever.  Respiratory:  Negative for shortness of breath.   Cardiovascular:  Negative for chest pain.  Musculoskeletal:  Negative for arthralgias.  Skin:  Negative for rash.  Psychiatric/Behavioral:  Positive for dysphoric mood.     Objective:  BP 106/66   Pulse 89   Temp 97.7 F (36.5 C)   Ht 5' 11" (1.803 m)   Wt 203 lb 3.2 oz (92.2 kg)   SpO2 95%   BMI 28.34 kg/m   BP Readings from Last 3 Encounters:  08/31/22 106/66  12/27/21 125/76  06/25/21 117/72    Wt Readings from Last 3 Encounters:  08/31/22 203 lb 3.2 oz (92.2 kg)  12/27/21 207 lb 12.8 oz (94.3 kg)  06/25/21 206 lb 3.2 oz (93.5 kg)     Physical Exam  Vitals reviewed.  Constitutional:      Appearance: He is well-developed.  HENT:     Head: Normocephalic and atraumatic.     Right Ear: External ear normal.     Left Ear: External ear normal.     Mouth/Throat:     Pharynx: No oropharyngeal exudate or posterior oropharyngeal erythema.  Eyes:     Pupils: Pupils are equal, round, and reactive to light.  Cardiovascular:     Rate and Rhythm: Normal rate and regular rhythm.     Heart sounds: No murmur heard. Pulmonary:     Effort: No respiratory distress.     Breath sounds: Normal breath sounds.  Musculoskeletal:     Cervical back: Normal range of motion and neck supple.  Neurological:     Mental Status: He is alert and oriented to person, place, and time.     No results found for: "HGBA1C"  Lab Results  Component Value Date    WBC 8.3 12/27/2021   HGB 14.5 12/27/2021   HCT 41.6 12/27/2021   PLT 300 12/27/2021   GLUCOSE 101 (H) 12/27/2021   CHOL 204 (H) 12/27/2021   TRIG 198 (H) 12/27/2021   HDL 50 12/27/2021   LDLCALC 119 (H) 12/27/2021   ALT 19 12/27/2021   AST 17 12/27/2021   NA 141 12/27/2021   K 4.3 12/27/2021   CL 105 12/27/2021   CREATININE 1.11 12/27/2021   BUN 16 12/27/2021   CO2 20 12/27/2021   TSH 0.805 04/17/2018    Patient was never admitted.  Assessment & Plan:   Duc was seen today for medical management of chronic issues.  Diagnoses and all orders for this visit:  Hyperlipidemia, unspecified hyperlipidemia type -     Lipid panel -     CMP14+EGFR  Depression, major, single episode, moderate (HCC)  Gastroesophageal reflux disease with esophagitis without hemorrhage  Other orders -     escitalopram (LEXAPRO) 20 MG tablet; Take 1 tablet (20 mg total) by mouth daily.   I am having Conception Chancy maintain his fenofibrate, pantoprazole, and escitalopram.  Meds ordered this encounter  Medications   escitalopram (LEXAPRO) 20 MG tablet    Sig: Take 1 tablet (20 mg total) by mouth daily.    Dispense:  90 tablet    Refill:  3     Follow-up: Return in about 6 weeks (around 10/12/2022).  Claretta Fraise, M.D.

## 2022-09-01 ENCOUNTER — Other Ambulatory Visit: Payer: Managed Care, Other (non HMO)

## 2022-09-02 LAB — CMP14+EGFR
ALT: 24 IU/L (ref 0–44)
AST: 21 IU/L (ref 0–40)
Albumin/Globulin Ratio: 1.8 (ref 1.2–2.2)
Albumin: 4.4 g/dL (ref 3.8–4.9)
Alkaline Phosphatase: 51 IU/L (ref 44–121)
BUN/Creatinine Ratio: 11 (ref 9–20)
BUN: 12 mg/dL (ref 6–24)
Bilirubin Total: 0.3 mg/dL (ref 0.0–1.2)
CO2: 21 mmol/L (ref 20–29)
Calcium: 9.3 mg/dL (ref 8.7–10.2)
Chloride: 105 mmol/L (ref 96–106)
Creatinine, Ser: 1.11 mg/dL (ref 0.76–1.27)
Globulin, Total: 2.4 g/dL (ref 1.5–4.5)
Glucose: 98 mg/dL (ref 70–99)
Potassium: 4.6 mmol/L (ref 3.5–5.2)
Sodium: 143 mmol/L (ref 134–144)
Total Protein: 6.8 g/dL (ref 6.0–8.5)
eGFR: 80 mL/min/{1.73_m2} (ref >59)

## 2022-09-02 LAB — LIPID PANEL
Chol/HDL Ratio: 4.2 ratio (ref 0.0–5.0)
Cholesterol, Total: 203 mg/dL — ABNORMAL HIGH (ref 100–199)
HDL: 48 mg/dL (ref >39)
LDL Chol Calc (NIH): 126 mg/dL — ABNORMAL HIGH (ref 0–99)
Triglycerides: 163 mg/dL — ABNORMAL HIGH (ref 0–149)
VLDL Cholesterol Cal: 29 mg/dL (ref 5–40)

## 2022-10-19 ENCOUNTER — Encounter: Payer: Self-pay | Admitting: Family Medicine

## 2022-10-19 ENCOUNTER — Ambulatory Visit (INDEPENDENT_AMBULATORY_CARE_PROVIDER_SITE_OTHER): Payer: Managed Care, Other (non HMO) | Admitting: Family Medicine

## 2022-10-19 VITALS — BP 114/62 | HR 68 | Temp 97.9°F | Ht 71.0 in | Wt 206.4 lb

## 2022-10-19 DIAGNOSIS — F321 Major depressive disorder, single episode, moderate: Secondary | ICD-10-CM

## 2022-10-19 MED ORDER — QUETIAPINE FUMARATE 25 MG PO TABS: 25.0000 mg | ORAL_TABLET | Freq: Every day | ORAL | 1 refills | Status: DC

## 2022-10-19 NOTE — Patient Instructions (Addendum)
Your provider wants you to schedule an appointment with a Psychologist/Psychiatrist. The following list of offices requires the patient to call and make their own appointment, as there is information they need that only you can provide. Please feel free to choose form the following providers:  [facility] [place]  [facility]   [phone] Crisis Recovery in [place] [phone]  [facility]  [phone] [place], [place]  (Scheduled through [facility]) Must call and do an interview for appointment. Sees Children / Accepts Medicaid  [facility]    [phone]  [address], [address]    [place], [place]       [facility]  [phone] [address] [place], [place]  Evaluates for Autism but does not treat it Sees Children / Accepts Medicaid  [facility]    [phone] [address], [address]   [place], [place] Medication management, substance abuse, bipolar, grief, family, marriage, OCD, anxiety, PTSD Sees children / Accepts Medicaid  [facility]    [phone] [address] [place], [place] Sees children / Accepts [facility]  [facility]  [phone] [address] [place], [place]   Dr Estelle Grumbles     [phone] [address] [place], [place]  Sees ADD & ADHD for treatment Accepts Medicaid  [facility]  [phone] [place], [place] Evaluates for Autism Accepts [facility]  [facility]  [phone] [address] [place], [place]  Does Adult ADD evaluations Does not accept Medicaid  [facility]   [phone] [address] [place], [place] Uses animal therapy  Sees children as young as 30 years old Accepts [facility]     [phone]    [address]  [place], [place] [address] Sees  children Accepts Medicaid

## 2022-10-19 NOTE — Progress Notes (Signed)
Subjective:  Patient ID: Vincent Sandoval, male    DOB: 12-16-1970  Age: 51 y.o. MRN: [id number]  CC: Follow-up and Depression   HPI Vincent Sandoval presents for depression recheck. Very worried about his 37 year old son's Mom. Going in a bad direction. Sees it causing hurt for his son. She ignores son. Pt. Has full responsibility. Not sleeping well. Ruminating.     10/19/2022    8:02 AM 08/31/2022   11:18 AM 08/31/2022   11:07 AM  Depression screen PHQ 2/9  Decreased Interest 0 0 0  Down, Depressed, Hopeless 2 0 0  PHQ - 2 Score 2 0 0  Altered sleeping 2    Tired, decreased energy 2    Change in appetite 0    Feeling bad or failure about yourself  0    Trouble concentrating 2    Moving slowly or fidgety/restless 0    Suicidal thoughts 0    PHQ-9 Score 8    Difficult doing work/chores Not difficult at all      History Vincent Sandoval has a past medical history of Depression, major, single episode, moderate (HCC) (03/18/2021) and Hyperlipidemia (08/31/2022).   He has a past surgical history that includes Exploratory laparotomy (Left).   His family history includes Diabetes in his brother; Hearing loss in his brother; Hypertension in his brother and brother.He reports that he has been smoking cigarettes. He has never used smokeless tobacco. He reports that he does not drink alcohol and does not use drugs.    ROS Review of Systems  Objective:  BP 114/62   Pulse 68   Temp 97.9 F (36.6 C)   Ht 5\' 11"  (1.803 m)   Wt 206 lb 6.4 oz (93.6 kg)   SpO2 97%   BMI 28.79 kg/m   BP Readings from Last 3 Encounters:  10/19/22 114/62  08/31/22 106/66  12/27/21 125/76    Wt Readings from Last 3 Encounters:  10/19/22 206 lb 6.4 oz (93.6 kg)  08/31/22 203 lb 3.2 oz (92.2 kg)  12/27/21 207 lb 12.8 oz (94.3 kg)     Physical Exam Vitals reviewed.  Constitutional:      Appearance: He is well-developed.  HENT:     Head: Normocephalic and atraumatic.     Right Ear: External ear  normal.     Left Ear: External ear normal.     Mouth/Throat:     Pharynx: No oropharyngeal exudate or posterior oropharyngeal erythema.  Eyes:     Pupils: Pupils are equal, round, and reactive to light.  Cardiovascular:     Rate and Rhythm: Normal rate and regular rhythm.     Heart sounds: No murmur heard. Pulmonary:     Effort: No respiratory distress.     Breath sounds: Normal breath sounds.  Musculoskeletal:     Cervical back: Normal range of motion and neck supple.  Neurological:     Mental Status: He is alert and oriented to person, place, and time.       Assessment & Plan:   Link was seen today for follow-up and depression.  Diagnoses and all orders for this visit:  Depression, major, single episode, moderate (HCC)  Other orders -     QUEtiapine (SEROQUEL) 25 MG tablet; Take 1 tablet (25 mg total) by mouth at bedtime.       I am having Vincent Sandoval start on QUEtiapine. I am also having him maintain his fenofibrate, escitalopram, and pantoprazole.  Allergies as of  10/19/2022   No Known Allergies      Medication List        Accurate as of October 19, 2022  9:00 AM. If you have any questions, ask your nurse or doctor.          escitalopram 20 MG tablet Commonly known as: LEXAPRO Take 1 tablet (20 mg total) by mouth daily.   fenofibrate 160 MG tablet Take 1 tablet (160 mg total) by mouth daily. For cholesterol and triglyceride   pantoprazole 40 MG tablet Commonly known as: PROTONIX Take 1 tablet (40 mg total) by mouth daily. For stomach   QUEtiapine 25 MG tablet Commonly known as: SEROQUEL Take 1 tablet (25 mg total) by mouth at bedtime. Started by: Mechele Claude, MD       Recommended counseling. Recommendations sheet given.  Follow-up: Return in about 6 weeks (around 11/30/2022).  Mechele Claude, M.D.

## 2022-10-28 ENCOUNTER — Other Ambulatory Visit: Payer: Self-pay

## 2022-10-28 ENCOUNTER — Telehealth: Payer: Self-pay | Admitting: Family Medicine

## 2022-10-28 MED ORDER — QUETIAPINE FUMARATE 25 MG PO TABS: 25.0000 mg | ORAL_TABLET | Freq: Every day | ORAL | 1 refills | Status: DC

## 2022-11-11 ENCOUNTER — Ambulatory Visit: Payer: Managed Care, Other (non HMO) | Admitting: Family Medicine

## 2022-11-11 ENCOUNTER — Encounter: Payer: Self-pay | Admitting: Family Medicine

## 2022-12-08 ENCOUNTER — Ambulatory Visit: Payer: Managed Care, Other (non HMO) | Admitting: Family Medicine

## 2022-12-12 ENCOUNTER — Encounter: Payer: Self-pay | Admitting: Family Medicine

## 2022-12-13 ENCOUNTER — Ambulatory Visit (INDEPENDENT_AMBULATORY_CARE_PROVIDER_SITE_OTHER): Payer: Managed Care, Other (non HMO) | Admitting: Family Medicine

## 2022-12-13 ENCOUNTER — Encounter: Payer: Self-pay | Admitting: Family Medicine

## 2022-12-13 VITALS — BP 104/67 | HR 70 | Temp 98.0°F | Ht 71.0 in | Wt 202.4 lb

## 2022-12-13 DIAGNOSIS — F321 Major depressive disorder, single episode, moderate: Secondary | ICD-10-CM | POA: Diagnosis not present

## 2022-12-13 MED ORDER — PANTOPRAZOLE SODIUM 40 MG PO TBEC: 40.0000 mg | DELAYED_RELEASE_TABLET | Freq: Every day | ORAL | 3 refills | Status: DC

## 2022-12-13 MED ORDER — FENOFIBRATE 160 MG PO TABS: 160.0000 mg | ORAL_TABLET | Freq: Every day | ORAL | 3 refills | Status: DC

## 2022-12-13 MED ORDER — ESCITALOPRAM OXALATE 20 MG PO TABS: 20.0000 mg | ORAL_TABLET | Freq: Every day | ORAL | 3 refills | Status: DC

## 2022-12-13 NOTE — Progress Notes (Signed)
Subjective:  Patient ID: Vincent Sandoval, male    DOB: 11/07/1971  Age: 52 y.o. MRN: [id number]  CC: Depression   HPI Vincent Sandoval presents for dealing with depression. Stuff coming at me. Job is far away. [place].  Hard to get Public librarian. Son's mom not involved in his care. Has 49 year old son whose mom died when Vincent Sandoval was 79. Works evenings. Trying to find a day shift job closer to home. Vincent Sandoval couldn't afford the lexapro due to loss of insurance. Has it back now, needs to go back on med. The PHQ was reviewed. Sx of depression are underestimated on it based on discussion Vincent Sandoval has some good days but is severely depressed most of the time.      12/13/2022   10:52 AM 12/13/2022   10:44 AM 10/19/2022    8:02 AM  Depression screen PHQ 2/9  Decreased Interest 0 0 0  Down, Depressed, Hopeless 1 0 2  PHQ - 2 Score 1 0 2  Altered sleeping 1  2  Tired, decreased energy 1  2  Change in appetite 1  0  Feeling bad or failure about yourself  0  0  Trouble concentrating 1  2  Moving slowly or fidgety/restless 0  0  Suicidal thoughts 0  0  PHQ-9 Score 5  8  Difficult doing work/chores Somewhat difficult  Not difficult at all    History Vincent Sandoval has a past medical history of Depression, major, single episode, moderate ([place]) (03/18/2021) and Hyperlipidemia (08/31/2022).   Vincent Sandoval has a past surgical history that includes Exploratory laparotomy (Left).   His family history includes Diabetes in his brother; Hearing loss in his brother; Hypertension in his brother and brother.Vincent Sandoval reports that Vincent Sandoval has been smoking cigarettes. Vincent Sandoval has never used smokeless tobacco. Vincent Sandoval reports that Vincent Sandoval does not drink alcohol and does not use drugs.    ROS Review of Systems  Constitutional:  Negative for fever.  Respiratory:  Negative for shortness of breath.   Cardiovascular:  Negative for chest pain.  Musculoskeletal:  Negative for arthralgias.  Skin:  Negative for rash.  Psychiatric/Behavioral:  Positive for dysphoric mood.  The patient is nervous/anxious.     Objective:  BP 104/67   Pulse 70   Temp 98 F (36.7 C)   Ht 5\' 11"  (1.803 m)   Wt 202 lb 6.4 oz (91.8 kg)   SpO2 97%   BMI 28.23 kg/m   BP Readings from Last 3 Encounters:  12/13/22 104/67  10/19/22 114/62  08/31/22 106/66    Wt Readings from Last 3 Encounters:  12/13/22 202 lb 6.4 oz (91.8 kg)  10/19/22 206 lb 6.4 oz (93.6 kg)  08/31/22 203 lb 3.2 oz (92.2 kg)     Physical Exam Vitals reviewed.  Constitutional:      Appearance: Vincent Sandoval is well-developed.  HENT:     Head: Normocephalic and atraumatic.     Right Ear: External ear normal.     Left Ear: External ear normal.     Mouth/Throat:     Pharynx: No oropharyngeal exudate or posterior oropharyngeal erythema.  Eyes:     Pupils: Pupils are equal, round, and reactive to light.  Cardiovascular:     Rate and Rhythm: Normal rate and regular rhythm.     Heart sounds: No murmur heard. Pulmonary:     Effort: No respiratory distress.     Breath sounds: Normal breath sounds.  Musculoskeletal:     Cervical back: Normal range  of motion and neck supple.  Neurological:     Mental Status: Vincent Sandoval is alert and oriented to person, place, and time.       Assessment & Plan:   Dyami was seen today for depression.  Diagnoses and all orders for this visit:  Depression, major, single episode, moderate ([place])  Other orders -     escitalopram (LEXAPRO) 20 MG tablet; Take 1 tablet (20 mg total) by mouth daily. -     fenofibrate 160 MG tablet; Take 1 tablet (160 mg total) by mouth daily. For cholesterol and triglyceride -     pantoprazole (PROTONIX) 40 MG tablet; Take 1 tablet (40 mg total) by mouth daily. For stomach       I am having Conception Chancy maintain his QUEtiapine, escitalopram, fenofibrate, and pantoprazole.  Allergies as of 12/13/2022   No Known Allergies      Medication List        Accurate as of December 13, 2022  8:54 PM. If you have any questions, ask your nurse or  doctor.          escitalopram 20 MG tablet Commonly known as: LEXAPRO Take 1 tablet (20 mg total) by mouth daily.   fenofibrate 160 MG tablet Take 1 tablet (160 mg total) by mouth daily. For cholesterol and triglyceride   pantoprazole 40 MG tablet Commonly known as: PROTONIX Take 1 tablet (40 mg total) by mouth daily. For stomach   QUEtiapine 25 MG tablet Commonly known as: SEROQUEL Take 1 tablet (25 mg total) by mouth at bedtime.         Follow-up: Return in about 2 months (around 02/11/2023).  Claretta Fraise, M.D.

## 2023-02-13 ENCOUNTER — Ambulatory Visit: Payer: Self-pay | Admitting: Family Medicine

## 2023-02-21 ENCOUNTER — Ambulatory Visit: Payer: Self-pay | Admitting: Family Medicine

## 2023-02-28 ENCOUNTER — Ambulatory Visit: Payer: Managed Care, Other (non HMO) | Admitting: Family Medicine

## 2023-05-31 ENCOUNTER — Encounter: Payer: Managed Care, Other (non HMO) | Admitting: Family Medicine

## 2023-06-07 ENCOUNTER — Encounter: Payer: Self-pay | Admitting: Family Medicine

## 2023-06-14 ENCOUNTER — Encounter: Payer: Self-pay | Admitting: Family Medicine

## 2023-06-14 ENCOUNTER — Ambulatory Visit (INDEPENDENT_AMBULATORY_CARE_PROVIDER_SITE_OTHER): Payer: Medicaid Other | Admitting: Family Medicine

## 2023-06-14 VITALS — BP 136/88 | HR 90 | Temp 97.8°F | Ht 71.0 in | Wt 205.8 lb

## 2023-06-14 DIAGNOSIS — Z125 Encounter for screening for malignant neoplasm of prostate: Secondary | ICD-10-CM

## 2023-06-14 DIAGNOSIS — E785 Hyperlipidemia, unspecified: Secondary | ICD-10-CM

## 2023-06-14 DIAGNOSIS — Z23 Encounter for immunization: Secondary | ICD-10-CM | POA: Diagnosis not present

## 2023-06-14 DIAGNOSIS — F321 Major depressive disorder, single episode, moderate: Secondary | ICD-10-CM

## 2023-06-14 DIAGNOSIS — Z0001 Encounter for general adult medical examination with abnormal findings: Secondary | ICD-10-CM | POA: Diagnosis not present

## 2023-06-14 DIAGNOSIS — K2100 Gastro-esophageal reflux disease with esophagitis, without bleeding: Secondary | ICD-10-CM

## 2023-06-14 MED ORDER — QUETIAPINE FUMARATE 25 MG PO TABS: 25.0000 mg | ORAL_TABLET | Freq: Every day | ORAL | 1 refills | Status: DC

## 2023-06-14 NOTE — Progress Notes (Signed)
Subjective:  Patient ID: Vincent Sandoval, male    DOB: 07-Jun-1971  Age: 52 y.o. MRN: [id number]  CC: Annual Exam   HPI Vincent Sandoval presents for Annual exam.      06/14/2023    1:40 PM 12/13/2022   10:52 AM 12/13/2022   10:44 AM  Depression screen PHQ 2/9  Decreased Interest 0 0 0  Down, Depressed, Hopeless 0 1 0  PHQ - 2 Score 0 1 0  Altered sleeping  1   Tired, decreased energy  1   Change in appetite  1   Feeling bad or failure about yourself   0   Trouble concentrating  1   Moving slowly or fidgety/restless  0   Suicidal thoughts  0   PHQ-9 Score  5   Difficult doing work/chores  Somewhat difficult       12/13/2022   10:53 AM 10/19/2022    8:04 AM 08/31/2022   11:19 AM 03/16/2021    1:58 PM  GAD 7 : Generalized Anxiety Score  Nervous, Anxious, on Edge 1 1 0 1  Control/stop worrying 1 1 0 1  Worry too much - different things 1 1 1 1   Trouble relaxing 1 1 1 1   Restless 1 0 0 1  Easily annoyed or irritable 1 1 0 0  Afraid - awful might happen 1 1 1 1   Total GAD 7 Score 7 6 3 6   Anxiety Difficulty Not difficult at all Not difficult at all Not difficult at all Somewhat difficult     History Vincent Sandoval has a past medical history of Depression, major, single episode, moderate (HCC) (03/18/2021) and Hyperlipidemia (08/31/2022).   He has a past surgical history that includes Exploratory laparotomy (Left).   His family history includes Diabetes in his brother; Hearing loss in his brother; Hypertension in his brother and brother.He reports that he has been smoking cigarettes. He has never used smokeless tobacco. He reports that he does not drink alcohol and does not use drugs.    ROS Review of Systems  Constitutional:  Negative for activity change, fatigue and unexpected weight change.  HENT:  Negative for congestion, ear pain, hearing loss, postnasal drip and trouble swallowing.   Eyes:  Negative for pain and visual disturbance.  Respiratory:  Negative for cough, chest  tightness and shortness of breath.   Cardiovascular:  Negative for chest pain, palpitations and leg swelling.  Gastrointestinal:  Negative for abdominal distention, abdominal pain, blood in stool, constipation, diarrhea, nausea and vomiting.  Endocrine: Negative for cold intolerance, heat intolerance and polydipsia.  Genitourinary:  Negative for difficulty urinating, dysuria, flank pain, frequency and urgency.  Musculoskeletal:  Negative for arthralgias and joint swelling.  Skin:  Positive for pallor and rash (back of left leg X 1 month). Negative for color change and wound.  Neurological:  Negative for dizziness, syncope, speech difficulty, weakness, light-headedness, numbness and headaches.  Hematological:  Does not bruise/bleed easily.  Psychiatric/Behavioral:  Negative for confusion, decreased concentration, dysphoric mood and sleep disturbance. The patient is not nervous/anxious.     Objective:  BP 136/88   Pulse 90   Temp 97.8 F (36.6 C)   Ht 5\' 11"  (1.803 m)   Wt 205 lb 12.8 oz (93.4 kg)   SpO2 96%   BMI 28.70 kg/m   BP Readings from Last 3 Encounters:  06/14/23 136/88  12/13/22 104/67  10/19/22 114/62    Wt Readings from Last 3 Encounters:  06/14/23 205 lb 12.8  oz (93.4 kg)  12/13/22 202 lb 6.4 oz (91.8 kg)  10/19/22 206 lb 6.4 oz (93.6 kg)     Physical Exam Constitutional:      Appearance: He is well-developed.  HENT:     Head: Normocephalic and atraumatic.  Eyes:     Pupils: Pupils are equal, round, and reactive to light.  Neck:     Thyroid: No thyromegaly.     Trachea: No tracheal deviation.  Cardiovascular:     Rate and Rhythm: Normal rate and regular rhythm.     Heart sounds: Normal heart sounds. No murmur heard.    No friction rub. No gallop.  Pulmonary:     Breath sounds: Normal breath sounds. No wheezing or rales.  Abdominal:     General: Bowel sounds are normal. There is no distension.     Palpations: Abdomen is soft. There is no mass.      Tenderness: There is no abdominal tenderness.     Hernia: There is no hernia in the left inguinal area.  Genitourinary:    Penis: Normal.      Testes: Normal.  Musculoskeletal:        General: Normal range of motion.     Cervical back: Normal range of motion.  Lymphadenopathy:     Cervical: No cervical adenopathy.  Skin:    General: Skin is warm and dry.  Neurological:     Mental Status: He is alert and oriented to person, place, and time.       Assessment & Plan:   Vincent Sandoval was seen today for annual exam.  Diagnoses and all orders for this visit:  Hyperlipidemia, unspecified hyperlipidemia type -     CBC with Differential/Platelet -     CMP14+EGFR -     Lipid panel -     Ambulatory referral to Gastroenterology  Screening for prostate cancer -     PSA, total and free  Depression, major, single episode, moderate (HCC) -     CBC with Differential/Platelet -     CMP14+EGFR  Gastroesophageal reflux disease with esophagitis without hemorrhage -     CBC with Differential/Platelet -     CMP14+EGFR -     Ambulatory referral to Gastroenterology  Other orders -     QUEtiapine (SEROQUEL) 25 MG tablet; Take 1 tablet (25 mg total) by mouth at bedtime.       I am having Vincent Sandoval maintain his escitalopram, fenofibrate, pantoprazole, and QUEtiapine.  Allergies as of 06/14/2023   No Known Allergies      Medication List        Accurate as of June 14, 2023  2:21 PM. If you have any questions, ask your nurse or doctor.          escitalopram 20 MG tablet Commonly known as: LEXAPRO Take 1 tablet (20 mg total) by mouth daily.   fenofibrate 160 MG tablet Take 1 tablet (160 mg total) by mouth daily. For cholesterol and triglyceride   pantoprazole 40 MG tablet Commonly known as: PROTONIX Take 1 tablet (40 mg total) by mouth daily. For stomach   QUEtiapine 25 MG tablet Commonly known as: SEROQUEL Take 1 tablet (25 mg total) by mouth at bedtime.       DASH  hadout given  Follow-up: Return in about 6 months (around 12/15/2023).  Vincent Sandoval, M.D.

## 2023-06-14 NOTE — Addendum Note (Signed)
Addended by: Adella Hare B on: 06/14/2023 04:10 PM   Modules accepted: Orders

## 2023-06-14 NOTE — Patient Instructions (Signed)

## 2023-06-15 LAB — CBC WITH DIFFERENTIAL/PLATELET
Basophils Absolute: 0.1 10*3/uL (ref 0.0–0.2)
Basos: 1 %
EOS (ABSOLUTE): 0.2 10*3/uL (ref 0.0–0.4)
Eos: 2 %
Hematocrit: 46.5 % (ref 37.5–51.0)
Immature Grans (Abs): 0 10*3/uL (ref 0.0–0.1)
Immature Granulocytes: 0 %
Lymphocytes Absolute: 3.1 10*3/uL (ref 0.7–3.1)
Lymphs: 30 %
MCH: 30.4 pg (ref 26.6–33.0)
MCHC: 33.8 g/dL (ref 31.5–35.7)
MCV: 90 fL (ref 79–97)
Monocytes: 9 %
Neutrophils Absolute: 6 10*3/uL (ref 1.4–7.0)
Neutrophils: 58 %
Platelets: 275 10*3/uL (ref 150–450)
RBC: 5.16 x10E6/uL (ref 4.14–5.80)
RDW: 13.3 % (ref 11.6–15.4)
WBC: 10.3 10*3/uL (ref 3.4–10.8)

## 2023-06-15 LAB — CMP14+EGFR
AST: 26 IU/L (ref 0–40)
Albumin: 4.5 g/dL (ref 3.8–4.9)
Alkaline Phosphatase: 64 IU/L (ref 44–121)
BUN/Creatinine Ratio: 14 (ref 9–20)
BUN: 17 mg/dL (ref 6–24)
Bilirubin Total: 0.4 mg/dL (ref 0.0–1.2)
CO2: 22 mmol/L (ref 20–29)
Calcium: 9.4 mg/dL (ref 8.7–10.2)
Chloride: 101 mmol/L (ref 96–106)
Creatinine, Ser: 1.24 mg/dL (ref 0.76–1.27)
Globulin, Total: 2.9 g/dL (ref 1.5–4.5)
Glucose: 94 mg/dL (ref 70–99)
Potassium: 4.6 mmol/L (ref 3.5–5.2)
Sodium: 139 mmol/L (ref 134–144)
Total Protein: 7.4 g/dL (ref 6.0–8.5)
eGFR: 70 mL/min/{1.73_m2} (ref >59)

## 2023-06-15 LAB — LIPID PANEL
Chol/HDL Ratio: 3.4 ratio (ref 0.0–5.0)
Cholesterol, Total: 189 mg/dL (ref 100–199)
HDL: 55 mg/dL (ref >39)
Triglycerides: 203 mg/dL — ABNORMAL HIGH (ref 0–149)
VLDL Cholesterol Cal: 35 mg/dL (ref 5–40)

## 2023-06-15 LAB — PSA, TOTAL AND FREE
PSA, Free Pct: 73.3 %
PSA, Free: 0.44 ng/mL
Prostate Specific Ag, Serum: 0.6 ng/mL (ref 0.0–4.0)

## 2023-06-15 NOTE — Progress Notes (Signed)
Hello Jorge,  Your lab result is normal and/or stable.Some minor variations that are not significant are commonly marked abnormal, but do not represent any medical problem for you.  Best regards, Claretta Fraise, M.D.

## 2023-07-06 ENCOUNTER — Telehealth: Payer: Self-pay | Admitting: Family Medicine

## 2023-07-06 NOTE — Telephone Encounter (Signed)
Patient calling to let us know that [facility] does not take his insurance and he needs to be referred to a different location. He would prefer it still be in [place] if possible.

## 2023-07-10 NOTE — Telephone Encounter (Signed)
I spoke with [facility] and confirmed they accept Patient's Insurance and re-directed Patient's Referral to their Office.

## 2023-07-14 ENCOUNTER — Telehealth: Payer: Self-pay

## 2023-07-14 NOTE — Telephone Encounter (Signed)
*  Primary  Pharmacy Patient Advocate Encounter  Received notification from [facility] that Prior Authorization for Fenofibrate 160MG  tablets  has been DENIED. Please advise how you'd like to proceed. Full denial letter will be uploaded to the media tab. See denial reason below.   PA #/Case ID/Reference #: [id number]

## 2023-07-17 ENCOUNTER — Other Ambulatory Visit: Payer: Self-pay | Admitting: Family Medicine

## 2023-07-17 MED ORDER — ATORVASTATIN CALCIUM 40 MG PO TABS: 40.0000 mg | ORAL_TABLET | Freq: Every day | ORAL | 3 refills | Status: DC

## 2023-07-17 NOTE — Telephone Encounter (Signed)
Please let the patient know that I sent their prescription to their pharmacy. Thanks, WS 

## 2023-07-18 ENCOUNTER — Other Ambulatory Visit: Payer: Self-pay | Admitting: *Deleted

## 2023-07-18 NOTE — Telephone Encounter (Signed)
PATIENT AWARE

## 2023-11-29 ENCOUNTER — Encounter: Payer: Self-pay | Admitting: *Deleted

## 2023-12-14 ENCOUNTER — Ambulatory Visit: Payer: Medicaid Other | Admitting: Family Medicine

## 2023-12-15 ENCOUNTER — Ambulatory Visit: Payer: Medicaid Other | Admitting: Family Medicine

## 2023-12-21 ENCOUNTER — Ambulatory Visit: Payer: Medicaid Other | Admitting: Family Medicine

## 2024-01-11 ENCOUNTER — Ambulatory Visit (INDEPENDENT_AMBULATORY_CARE_PROVIDER_SITE_OTHER): Payer: Medicaid Other | Admitting: Family Medicine

## 2024-01-11 ENCOUNTER — Encounter: Payer: Self-pay | Admitting: Family Medicine

## 2024-01-11 VITALS — BP 125/79 | HR 110 | Temp 98.1°F | Ht 71.0 in | Wt 205.4 lb

## 2024-01-11 DIAGNOSIS — F321 Major depressive disorder, single episode, moderate: Secondary | ICD-10-CM | POA: Diagnosis not present

## 2024-01-11 DIAGNOSIS — E785 Hyperlipidemia, unspecified: Secondary | ICD-10-CM

## 2024-01-11 DIAGNOSIS — J0100 Acute maxillary sinusitis, unspecified: Secondary | ICD-10-CM

## 2024-01-11 DIAGNOSIS — Z1211 Encounter for screening for malignant neoplasm of colon: Secondary | ICD-10-CM

## 2024-01-11 DIAGNOSIS — L309 Dermatitis, unspecified: Secondary | ICD-10-CM

## 2024-01-11 DIAGNOSIS — K2100 Gastro-esophageal reflux disease with esophagitis, without bleeding: Secondary | ICD-10-CM

## 2024-01-11 MED ORDER — FLUOCINONIDE 0.05 % EX CREA: 1.0000 | TOPICAL_CREAM | Freq: Two times a day (BID) | CUTANEOUS | 5 refills | Status: AC

## 2024-01-11 MED ORDER — ATORVASTATIN CALCIUM 40 MG PO TABS: 40.0000 mg | ORAL_TABLET | Freq: Every day | ORAL | 3 refills | Status: AC

## 2024-01-11 MED ORDER — ESCITALOPRAM OXALATE 20 MG PO TABS: 20.0000 mg | ORAL_TABLET | Freq: Every day | ORAL | 3 refills | Status: AC

## 2024-01-11 MED ORDER — AMOXICILLIN 875 MG PO TABS: 875.0000 mg | ORAL_TABLET | Freq: Two times a day (BID) | ORAL | 0 refills | Status: AC

## 2024-01-11 MED ORDER — QUETIAPINE FUMARATE 25 MG PO TABS: 25.0000 mg | ORAL_TABLET | Freq: Every day | ORAL | 1 refills | Status: DC

## 2024-01-11 MED ORDER — PANTOPRAZOLE SODIUM 40 MG PO TBEC: 40.0000 mg | DELAYED_RELEASE_TABLET | Freq: Every day | ORAL | 3 refills | Status: AC

## 2024-01-11 NOTE — Progress Notes (Signed)
 Subjective:  Patient ID: Vincent Sandoval, male    DOB: 12-02-1970  Age: 53 y.o. MRN: [id number]  CC: Medical Management of Chronic Issues (6 month), Cough (Symptoms for few days), and Nasal Congestion   HPI Vincent Sandoval presents for Symptoms include congestion, facial pain, nasal congestion, yellow productive cough, post nasal drip and sinus pressure. There is no fever, chills, or sweats. Onset of symptoms was 4 days ago, gradually worsening since that time.   Patient in for follow-up of GERD. Currently asymptomatic taking  PPI daily. There is no chest pain or heartburn. No hematemesis and no melena. No dysphagia or choking. Onset is remote. Progression is stable. Complicating factors, none.   in for follow-up of elevated cholesterol. Doing well without complaints on current medication. Denies side effects of statin including myalgia and arthralgia and nausea. Currently no chest pain, shortness of breath or other cardiovascular related symptoms noted.       12/13/2022   10:53 AM 10/19/2022    8:04 AM 08/31/2022   11:19 AM 03/16/2021    1:58 PM  GAD 7 : Generalized Anxiety Score  Nervous, Anxious, on Edge 1 1 0 1  Control/stop worrying 1 1 0 1  Worry too much - different things 1 1 1 1   Trouble relaxing 1 1 1 1   Restless 1 0 0 1  Easily annoyed or irritable 1 1 0 0  Afraid - awful might happen 1 1 1 1   Total GAD 7 Score 7 6 3 6   Anxiety Difficulty Not difficult at all Not difficult at all Not difficult at all Somewhat difficult          06/14/2023    1:40 PM 12/13/2022   10:52 AM 12/13/2022   10:44 AM  Depression screen PHQ 2/9  Decreased Interest 0 0 0  Down, Depressed, Hopeless 0 1 0  PHQ - 2 Score 0 1 0  Altered sleeping  1   Tired, decreased energy  1   Change in appetite  1   Feeling bad or failure about yourself   0   Trouble concentrating  1   Moving slowly or fidgety/restless  0   Suicidal thoughts  0   PHQ-9 Score  5   Difficult doing work/chores  Somewhat  difficult     History Vincent Sandoval has a past medical history of Depression, major, single episode, moderate (HCC) (03/18/2021) and Hyperlipidemia (08/31/2022).   He has a past surgical history that includes Exploratory laparotomy (Left).   His family history includes Diabetes in his brother; Hearing loss in his brother; Hypertension in his brother and brother.He reports that he has been smoking cigarettes. He has never used smokeless tobacco. He reports that he does not drink alcohol and does not use drugs.    ROS Review of Systems  Constitutional:  Negative for activity change, appetite change, chills and fever.  HENT:  Positive for congestion, postnasal drip, rhinorrhea and sinus pressure. Negative for ear discharge, ear pain, hearing loss, nosebleeds, sneezing and trouble swallowing.   Respiratory:  Negative for chest tightness and shortness of breath.   Cardiovascular:  Negative for chest pain and palpitations.  Musculoskeletal:  Negative for arthralgias.  Skin:  Positive for rash (dry, eczematous patches on left arm, back).    Objective:  BP 125/79   Pulse (!) 110   Temp 98.1 F (36.7 C) (Temporal)   Ht 5\' 11"  (1.803 m)   Wt 205 lb 6.4 oz (93.2 kg)   SpO2  97%   BMI 28.65 kg/m   BP Readings from Last 3 Encounters:  01/11/24 125/79  06/14/23 136/88  12/13/22 104/67    Wt Readings from Last 3 Encounters:  01/11/24 205 lb 6.4 oz (93.2 kg)  06/14/23 205 lb 12.8 oz (93.4 kg)  12/13/22 202 lb 6.4 oz (91.8 kg)     Physical Exam Vitals reviewed.  Constitutional:      Appearance: He is well-developed.  HENT:     Head: Normocephalic and atraumatic.     Right Ear: Tympanic membrane and external ear normal. No decreased hearing noted.     Left Ear: Tympanic membrane and external ear normal. No decreased hearing noted.     Nose: Mucosal edema present.     Right Sinus: No frontal sinus tenderness.     Left Sinus: No frontal sinus tenderness.     Mouth/Throat:     Pharynx: No  oropharyngeal exudate or posterior oropharyngeal erythema.  Eyes:     Pupils: Pupils are equal, round, and reactive to light.  Neck:     Meningeal: Brudzinski's sign absent.  Cardiovascular:     Rate and Rhythm: Normal rate and regular rhythm.     Heart sounds: No murmur heard. Pulmonary:     Effort: No respiratory distress.     Breath sounds: Normal breath sounds.  Musculoskeletal:     Cervical back: Normal range of motion and neck supple.  Lymphadenopathy:     Head:     Right side of head: No preauricular adenopathy.     Left side of head: No preauricular adenopathy.     Cervical:     Right cervical: No superficial cervical adenopathy.    Left cervical: No superficial cervical adenopathy.  Neurological:     Mental Status: He is alert and oriented to person, place, and time.       Assessment & Plan:   Vincent Sandoval was seen today for medical management of chronic issues, cough and nasal congestion.  Diagnoses and all orders for this visit:  Acute maxillary sinusitis, recurrence not specified  Depression, major, single episode, moderate (HCC)  Gastroesophageal reflux disease with esophagitis without hemorrhage  Hyperlipidemia, unspecified hyperlipidemia type  Screen for colon cancer -     Ambulatory referral to Gastroenterology  Eczema, unspecified type  Other orders -     atorvastatin (LIPITOR) 40 MG tablet; Take 1 tablet (40 mg total) by mouth daily. For cholesterol -     escitalopram (LEXAPRO) 20 MG tablet; Take 1 tablet (20 mg total) by mouth daily. -     pantoprazole (PROTONIX) 40 MG tablet; Take 1 tablet (40 mg total) by mouth daily. For stomach -     QUEtiapine (SEROQUEL) 25 MG tablet; Take 1 tablet (25 mg total) by mouth at bedtime. -     amoxicillin (AMOXIL) 875 MG tablet; Take 1 tablet (875 mg total) by mouth 2 (two) times daily for 10 days. -     fluocinonide cream (LIDEX) 0.05 %; Apply 1 Application topically 2 (two) times daily.       I am having Vincent Sandoval start on amoxicillin and fluocinonide cream. I am also having him maintain his atorvastatin, escitalopram, pantoprazole, and QUEtiapine.  Allergies as of 01/11/2024   No Known Allergies      Medication List        Accurate as of January 11, 2024 11:59 PM. If you have any questions, ask your nurse or doctor.  amoxicillin 875 MG tablet Commonly known as: AMOXIL Take 1 tablet (875 mg total) by mouth 2 (two) times daily for 10 days. Started by: Vincent Sandoval   atorvastatin 40 MG tablet Commonly known as: LIPITOR Take 1 tablet (40 mg total) by mouth daily. For cholesterol   escitalopram 20 MG tablet Commonly known as: LEXAPRO Take 1 tablet (20 mg total) by mouth daily.   fluocinonide cream 0.05 % Commonly known as: LIDEX Apply 1 Application topically 2 (two) times daily. Started by: Vincent Sandoval   pantoprazole 40 MG tablet Commonly known as: PROTONIX Take 1 tablet (40 mg total) by mouth daily. For stomach   QUEtiapine 25 MG tablet Commonly known as: SEROQUEL Take 1 tablet (25 mg total) by mouth at bedtime.         Follow-up: Return in about 6 weeks (around 02/22/2024).  Mechele Claude, M.D.

## 2024-02-22 ENCOUNTER — Ambulatory Visit: Payer: Medicaid Other | Admitting: Family Medicine

## 2024-02-23 ENCOUNTER — Encounter: Payer: Self-pay | Admitting: Family Medicine

## 2024-03-13 ENCOUNTER — Encounter: Payer: Self-pay | Admitting: Family Medicine

## 2024-03-13 ENCOUNTER — Ambulatory Visit (INDEPENDENT_AMBULATORY_CARE_PROVIDER_SITE_OTHER): Admitting: Family Medicine

## 2024-03-13 VITALS — BP 115/77 | HR 78 | Temp 97.3°F | Ht 71.0 in | Wt 204.0 lb

## 2024-03-13 DIAGNOSIS — E782 Mixed hyperlipidemia: Secondary | ICD-10-CM

## 2024-03-13 DIAGNOSIS — K2100 Gastro-esophageal reflux disease with esophagitis, without bleeding: Secondary | ICD-10-CM

## 2024-03-13 DIAGNOSIS — F321 Major depressive disorder, single episode, moderate: Secondary | ICD-10-CM | POA: Diagnosis not present

## 2024-03-13 LAB — LIPID PANEL

## 2024-03-13 NOTE — Progress Notes (Signed)
 Subjective:  Patient ID: Vincent Sandoval, male    DOB: Apr 10, 1971  Age: 53 y.o. MRN: [id number]  CC: Medical Management of Chronic Issues (No concerns at this time. )   HPI Vincent Sandoval presents for  in for follow-up of elevated cholesterol. Doing well without complaints on current medication. Denies side effects of statin including myalgia and arthralgia and nausea. Currently no chest pain, shortness of breath or other cardiovascular related symptoms noted.  Patient in for follow-up of GERD. Currently asymptomatic taking  PPI daily. There is no chest pain or heartburn. No hematemesis and no melena. No dysphagia or choking. Onset is remote. Progression is stable. Complicating factors, none.  Circumstances cuase a bit of depression. 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 Would lke to make more money.      03/13/2024    4:02 PM 06/14/2023    1:40 PM 12/13/2022   10:52 AM  Depression screen PHQ 2/9  Decreased Interest 0 0 0  Down, Depressed, Hopeless 1 0 1  PHQ - 2 Score 1 0 1  Altered sleeping 1  1  Tired, decreased energy 1  1  Change in appetite 1  1  Feeling bad or failure about yourself  0  0  Trouble concentrating 1  1  Moving slowly or fidgety/restless 0  0  Suicidal thoughts 0  0  PHQ-9 Score 5  5  Difficult doing work/chores Not difficult at all  Somewhat difficult    History Vincent Sandoval has a past medical history of Depression, major, single episode, moderate (HCC) (03/18/2021) and Hyperlipidemia (08/31/2022).   Vincent Sandoval has a past surgical history that includes Exploratory laparotomy (Left).   His family history includes Diabetes in his brother; Hearing loss in his brother; Hypertension in his brother and brother.Vincent Sandoval reports that Vincent Sandoval has been smoking cigarettes. Vincent Sandoval has never used smokeless tobacco. Vincent Sandoval reports that Vincent Sandoval does not drink alcohol and does not use drugs.    ROS Review of Systems  Constitutional:  Negative for fever.  Respiratory:  Negative for shortness of  breath.   Cardiovascular:  Negative for chest pain.  Musculoskeletal:  Negative for arthralgias.  Skin:  Negative for rash.    Objective:  BP 115/77   Pulse 78   Temp (!) 97.3 F (36.3 C)   Ht 5\' 11"  (1.803 m)   Wt 204 lb (92.5 kg)   SpO2 92%   BMI 28.45 kg/m   BP Readings from Last 3 Encounters:  03/13/24 115/77  01/11/24 125/79  06/14/23 136/88    Wt Readings from Last 3 Encounters:  03/13/24 204 lb (92.5 kg)  01/11/24 205 lb 6.4 oz (93.2 kg)  06/14/23 205 lb 12.8 oz (93.4 kg)     Physical Exam Constitutional:      General: Vincent Sandoval is not in acute distress.    Appearance: Vincent Sandoval is well-developed.  HENT:     Head: Normocephalic and atraumatic.     Right Ear: External ear normal.     Left Ear: External ear normal.     Nose: Nose normal.  Eyes:     Conjunctiva/sclera: Conjunctivae normal.     Pupils: Pupils are equal, round, and reactive to light.  Cardiovascular:     Rate and Rhythm: Normal rate and regular rhythm.     Heart sounds: Normal heart sounds. No murmur heard. Pulmonary:     Effort: Pulmonary effort is normal. No respiratory distress.     Breath sounds: Normal breath sounds.  No wheezing or rales.  Abdominal:     Palpations: Abdomen is soft.     Tenderness: There is no abdominal tenderness.  Musculoskeletal:        General: Normal range of motion.     Cervical back: Normal range of motion and neck supple.  Skin:    General: Skin is warm and dry.  Neurological:     Mental Status: Vincent Sandoval is alert and oriented to person, place, and time.     Deep Tendon Reflexes: Reflexes are normal and symmetric.  Psychiatric:        Behavior: Behavior normal.        Thought Content: Thought content normal.        Judgment: Judgment normal.      Assessment & Plan:  Moderate mixed hyperlipidemia not requiring statin therapy -     CMP14+EGFR -     Lipid panel  Gastroesophageal reflux disease with esophagitis without hemorrhage -     CBC with Differential/Platelet -      CMP14+EGFR  Depression, major, single episode, moderate (HCC) -     CBC with Differential/Platelet -     CMP14+EGFR     Follow-up: Return in about 6 months (around 09/12/2024) for [facility] physical.  Vincent Sandoval, M.D.

## 2024-03-14 LAB — CBC WITH DIFFERENTIAL/PLATELET
Basophils Absolute: 0.1 10*3/uL (ref 0.0–0.2)
Basos: 1 %
EOS (ABSOLUTE): 0.1 10*3/uL (ref 0.0–0.4)
Eos: 1 %
Hematocrit: 41.1 % (ref 37.5–51.0)
Hemoglobin: 13.9 g/dL (ref 13.0–17.7)
Immature Grans (Abs): 0 10*3/uL (ref 0.0–0.1)
Immature Granulocytes: 0 %
Lymphocytes Absolute: 3.1 10*3/uL (ref 0.7–3.1)
Lymphs: 37 %
MCH: 31.1 pg (ref 26.6–33.0)
MCHC: 33.8 g/dL (ref 31.5–35.7)
MCV: 92 fL (ref 79–97)
Monocytes Absolute: 0.8 10*3/uL (ref 0.1–0.9)
Monocytes: 10 %
Neutrophils Absolute: 4.2 10*3/uL (ref 1.4–7.0)
Neutrophils: 51 %
Platelets: 210 10*3/uL (ref 150–450)
RBC: 4.47 x10E6/uL (ref 4.14–5.80)
RDW: 13.3 % (ref 11.6–15.4)
WBC: 8.3 10*3/uL (ref 3.4–10.8)

## 2024-03-14 LAB — CMP14+EGFR
ALT: 19 IU/L (ref 0–44)
AST: 22 IU/L (ref 0–40)
Albumin: 4.3 g/dL (ref 3.8–4.9)
Alkaline Phosphatase: 69 IU/L (ref 44–121)
BUN/Creatinine Ratio: 20 (ref 9–20)
BUN: 21 mg/dL (ref 6–24)
Bilirubin Total: 0.2 mg/dL (ref 0.0–1.2)
CO2: 19 mmol/L — ABNORMAL LOW (ref 20–29)
Calcium: 9.3 mg/dL (ref 8.7–10.2)
Chloride: 104 mmol/L (ref 96–106)
Creatinine, Ser: 1.06 mg/dL (ref 0.76–1.27)
Globulin, Total: 2.4 g/dL (ref 1.5–4.5)
Glucose: 100 mg/dL — ABNORMAL HIGH (ref 70–99)
Potassium: 4.4 mmol/L (ref 3.5–5.2)
Sodium: 139 mmol/L (ref 134–144)
Total Protein: 6.7 g/dL (ref 6.0–8.5)
eGFR: 84 mL/min/{1.73_m2} (ref >59)

## 2024-03-14 LAB — LIPID PANEL
Cholesterol, Total: 155 mg/dL (ref 100–199)
HDL: 64 mg/dL (ref >39)
LDL CALC COMMENT:: 2.4 ratio (ref 0.0–5.0)
LDL Chol Calc (NIH): 61 mg/dL (ref 0–99)
Triglycerides: 185 mg/dL — ABNORMAL HIGH (ref 0–149)
VLDL Cholesterol Cal: 30 mg/dL (ref 5–40)

## 2024-03-17 ENCOUNTER — Encounter: Payer: Self-pay | Admitting: Family Medicine

## 2024-03-17 NOTE — Progress Notes (Signed)
Hello Vincent Sandoval,  Your lab result is normal and/or stable.Some minor variations that are not significant are commonly marked abnormal, but do not represent any medical problem for you.  Best regards, Claretta Fraise, M.D.

## 2024-03-21 ENCOUNTER — Encounter: Payer: Self-pay | Admitting: *Deleted

## 2024-07-29 ENCOUNTER — Other Ambulatory Visit: Payer: Self-pay | Admitting: Family Medicine

## 2024-09-12 ENCOUNTER — Encounter: Admitting: Family Medicine

## 2024-10-02 ENCOUNTER — Encounter: Payer: Self-pay | Admitting: Family Medicine

## 2024-10-22 ENCOUNTER — Encounter: Admitting: Family Medicine

## 2024-11-01 ENCOUNTER — Other Ambulatory Visit: Payer: Self-pay | Admitting: Family Medicine

## 2024-12-17 ENCOUNTER — Encounter: Payer: Self-pay | Admitting: Family Medicine

## 2024-12-17 ENCOUNTER — Ambulatory Visit: Admitting: Family Medicine

## 2024-12-17 VITALS — BP 121/79 | HR 97 | Temp 97.4°F | Ht 71.0 in | Wt 219.0 lb

## 2024-12-17 DIAGNOSIS — Z0000 Encounter for general adult medical examination without abnormal findings: Secondary | ICD-10-CM

## 2024-12-17 DIAGNOSIS — K2100 Gastro-esophageal reflux disease with esophagitis, without bleeding: Secondary | ICD-10-CM

## 2024-12-17 DIAGNOSIS — F321 Major depressive disorder, single episode, moderate: Secondary | ICD-10-CM | POA: Diagnosis not present

## 2024-12-17 DIAGNOSIS — E785 Hyperlipidemia, unspecified: Secondary | ICD-10-CM

## 2024-12-17 DIAGNOSIS — Z125 Encounter for screening for malignant neoplasm of prostate: Secondary | ICD-10-CM | POA: Diagnosis not present

## 2024-12-17 MED ORDER — CELECOXIB 200 MG PO CAPS: 200.0000 mg | ORAL_CAPSULE | Freq: Every day | ORAL | 5 refills | Status: AC

## 2024-12-17 MED ORDER — TADALAFIL 20 MG PO TABS: 20.0000 mg | ORAL_TABLET | Freq: Every day | ORAL | 5 refills | Status: AC
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
## 2024-12-18 ENCOUNTER — Other Ambulatory Visit

## 2024-12-18 LAB — URINALYSIS
Bilirubin, UA: NEGATIVE
Glucose, UA: NEGATIVE
Ketones, UA: NEGATIVE
Leukocytes,UA: NEGATIVE
Nitrite, UA: NEGATIVE
RBC, UA: NEGATIVE
Specific Gravity, UA: 1.02 (ref 1.005–1.030)
Urobilinogen, Ur: 0.2 mg/dL (ref 0.2–1.0)
pH, UA: 6.5 (ref 5.0–7.5)

## 2024-12-18 LAB — LIPID PANEL

## 2024-12-19 LAB — CBC WITH DIFFERENTIAL/PLATELET
Basophils Absolute: 0.1 10*3/uL (ref 0.0–0.2)
Basos: 1 %
EOS (ABSOLUTE): 0.2 10*3/uL (ref 0.0–0.4)
Eos: 3 %
Hematocrit: 44.8 % (ref 37.5–51.0)
Hemoglobin: 15 g/dL (ref 13.0–17.7)
Immature Grans (Abs): 0.1 10*3/uL (ref 0.0–0.1)
Immature Granulocytes: 1 %
Lymphocytes Absolute: 2.9 10*3/uL (ref 0.7–3.1)
Lymphs: 39 %
MCH: 30.6 pg (ref 26.6–33.0)
MCHC: 33.5 g/dL (ref 31.5–35.7)
MCV: 91 fL (ref 79–97)
Monocytes Absolute: 0.8 10*3/uL (ref 0.1–0.9)
Monocytes: 11 %
Neutrophils Absolute: 3.4 10*3/uL (ref 1.4–7.0)
Neutrophils: 45 %
Platelets: 257 10*3/uL (ref 150–450)
RBC: 4.9 x10E6/uL (ref 4.14–5.80)
RDW: 13.1 % (ref 11.6–15.4)
WBC: 7.5 10*3/uL (ref 3.4–10.8)

## 2024-12-19 LAB — CMP14+EGFR
ALT: 23 [IU]/L (ref 0–44)
AST: 20 [IU]/L (ref 0–40)
Albumin: 4.3 g/dL (ref 3.8–4.9)
Alkaline Phosphatase: 66 [IU]/L (ref 47–123)
BUN/Creatinine Ratio: 15 (ref 9–20)
BUN: 15 mg/dL (ref 6–24)
Bilirubin Total: 0.2 mg/dL (ref 0.0–1.2)
CO2: 21 mmol/L (ref 20–29)
Calcium: 9.4 mg/dL (ref 8.7–10.2)
Chloride: 105 mmol/L (ref 96–106)
Creatinine, Ser: 1.02 mg/dL (ref 0.76–1.27)
Globulin, Total: 2.7 g/dL (ref 1.5–4.5)
Glucose: 106 mg/dL — AB (ref 70–99)
Potassium: 4.5 mmol/L (ref 3.5–5.2)
Sodium: 140 mmol/L (ref 134–144)
Total Protein: 7 g/dL (ref 6.0–8.5)
eGFR: 87 mL/min/{1.73_m2}

## 2024-12-19 LAB — LIPID PANEL
Cholesterol, Total: 229 mg/dL — AB (ref 100–199)
HDL: 56 mg/dL
LDL CALC COMMENT:: 4.1 ratio (ref 0.0–5.0)
LDL Chol Calc (NIH): 130 mg/dL — AB (ref 0–99)
Triglycerides: 245 mg/dL — AB (ref 0–149)
VLDL Cholesterol Cal: 43 mg/dL — AB (ref 5–40)

## 2024-12-19 LAB — PSA, TOTAL AND FREE
PSA, Free Pct: 66
PSA, Free: 0.33 ng/mL
Prostate Specific Ag, Serum: 0.5 ng/mL (ref 0.0–4.0)

## 2025-06-16 ENCOUNTER — Ambulatory Visit: Admitting: Family Medicine
